# Patient Record
Sex: Female | Born: 2000
Health system: Southern US, Community
[De-identification: ages and names within clinical notes are randomized; demographics above are authoritative.]

## PROBLEM LIST (undated history)

## (undated) DIAGNOSIS — J45909 Unspecified asthma, uncomplicated: Secondary | ICD-10-CM

## (undated) DIAGNOSIS — L409 Psoriasis, unspecified: Secondary | ICD-10-CM

## (undated) DIAGNOSIS — F419 Anxiety disorder, unspecified: Secondary | ICD-10-CM

## (undated) DIAGNOSIS — B070 Plantar wart: Secondary | ICD-10-CM

## (undated) HISTORY — DX: Psoriasis, unspecified: L40.9

## (undated) HISTORY — DX: Plantar wart: B07.0

## (undated) HISTORY — PX: OTHER SURGICAL HISTORY: SHX169

## (undated) HISTORY — DX: Anxiety disorder, unspecified: F41.9

## (undated) HISTORY — DX: Unspecified asthma, uncomplicated: J45.909

---
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
## 2021-01-05 ENCOUNTER — Emergency Department
Admission: EM | Admit: 2021-01-05 | Discharge: 2021-01-05 | Disposition: A | Discharge status: Home / Self Care | Payer: Commercial Managed Care - PPO | Attending: Emergency Medicine | Admitting: Emergency Medicine

## 2021-01-05 ENCOUNTER — Other Ambulatory Visit: Payer: Self-pay

## 2021-01-05 ENCOUNTER — Ambulatory Visit
Admission: RE | Admit: 2021-01-05 | Discharge: 2021-01-05 | Payer: Commercial Managed Care - PPO | Source: Ambulatory Visit | Attending: Emergency Medicine | Admitting: Emergency Medicine

## 2021-01-05 ENCOUNTER — Emergency Department: Payer: Commercial Managed Care - PPO

## 2021-01-05 ENCOUNTER — Encounter: Payer: Self-pay | Admitting: Emergency Medicine

## 2021-01-05 VITALS — BP 129/80 | HR 85 | Temp 98.1°F | Resp 18

## 2021-01-05 DIAGNOSIS — R42 Dizziness and giddiness: Secondary | ICD-10-CM | POA: Insufficient documentation

## 2021-01-05 DIAGNOSIS — R109 Unspecified abdominal pain: Secondary | ICD-10-CM

## 2021-01-05 DIAGNOSIS — R531 Weakness: Secondary | ICD-10-CM

## 2021-01-05 DIAGNOSIS — R1032 Left lower quadrant pain: Secondary | ICD-10-CM | POA: Diagnosis not present

## 2021-01-05 DIAGNOSIS — M5412 Radiculopathy, cervical region: Secondary | ICD-10-CM | POA: Diagnosis not present

## 2021-01-05 DIAGNOSIS — R202 Paresthesia of skin: Secondary | ICD-10-CM | POA: Insufficient documentation

## 2021-01-05 DIAGNOSIS — R29898 Other symptoms and signs involving the musculoskeletal system: Secondary | ICD-10-CM | POA: Diagnosis not present

## 2021-01-05 LAB — CBC
HCT: 40.5 % (ref 36.0–46.0)
Hemoglobin: 13.9 g/dL (ref 12.0–15.0)
MCH: 30.5 pg (ref 26.0–34.0)
MCHC: 34.3 g/dL (ref 30.0–36.0)
MCV: 89 fL (ref 80.0–100.0)
Platelets: 318 10*3/uL (ref 150–400)
RBC: 4.55 MIL/uL (ref 3.87–5.11)
RDW: 12.4 % (ref 11.5–15.5)
WBC: 5 10*3/uL (ref 4.0–10.5)
nRBC: 0 % (ref 0.0–0.2)

## 2021-01-05 LAB — POCT URINALYSIS DIP (MANUAL ENTRY)
Bilirubin, UA: NEGATIVE
Blood, UA: NEGATIVE
Glucose, UA: NEGATIVE mg/dL
Ketones, POC UA: NEGATIVE mg/dL
Leukocytes, UA: NEGATIVE
Nitrite, UA: NEGATIVE
Protein Ur, POC: NEGATIVE mg/dL
Spec Grav, UA: 1.01 (ref 1.010–1.025)
Urobilinogen, UA: 0.2 E.U./dL
pH, UA: 5 (ref 5.0–8.0)

## 2021-01-05 LAB — BASIC METABOLIC PANEL
Anion gap: 8 (ref 5–15)
BUN: 10 mg/dL (ref 6–20)
CO2: 24 mmol/L (ref 22–32)
Calcium: 9.3 mg/dL (ref 8.9–10.3)
Chloride: 106 mmol/L (ref 98–111)
Creatinine, Ser: 0.89 mg/dL (ref 0.44–1.00)
GFR, Estimated: >60 mL/min (ref >60)
Glucose, Bld: 86 mg/dL (ref 70–99)
Potassium: 3.8 mmol/L (ref 3.5–5.1)
Sodium: 138 mmol/L (ref 135–145)

## 2021-01-05 LAB — POCT URINE PREGNANCY: Preg Test, Ur: NEGATIVE

## 2021-01-05 MED ORDER — DICYCLOMINE HCL 10 MG PO CAPS
20.0000 mg | ORAL_CAPSULE | Freq: Once | ORAL | Status: AC
  Administered 2021-01-05: 20 mg via ORAL
  Filled 2021-01-05: qty 2

## 2021-01-05 MED ORDER — PREDNISONE 10 MG (21) PO TBPK: ORAL_TABLET | ORAL | 0 refills | Status: DC

## 2021-01-05 MED ORDER — GABAPENTIN 300 MG PO CAPS
300.0000 mg | ORAL_CAPSULE | Freq: Once | ORAL | Status: AC
  Administered 2021-01-05: 300 mg via ORAL
  Filled 2021-01-05: qty 1

## 2021-01-05 MED ORDER — DICYCLOMINE HCL 10 MG PO CAPS: 10.0000 mg | ORAL_CAPSULE | Freq: Three times a day (TID) | ORAL | 0 refills | Status: DC | PRN

## 2021-01-05 NOTE — Discharge Instructions (Addendum)
Go to the emergency department for evaluation of your symptoms.    

## 2021-01-05 NOTE — Discharge Instructions (Addendum)
Please seek medical attention for any high fevers, chest pain, shortness of breath, change in behavior, persistent vomiting, bloody stool or any other new or concerning symptoms.  

## 2021-01-05 NOTE — ED Provider Notes (Signed)
[facility] Emergency Department Provider Note   ____________________________________________   I have reviewed the triage vital signs and the nursing notes.   HISTORY  Chief Complaint Left abdominal pain   History limited by: Not Limited   HPI Nicole Riddle is a 20 y.o. female who presents to the emergency department today with multiple complaints. The patient has complaint of left lower abdominal pain. Started last night. It has been constant. She has not noticed any change in defecation. No vomiting. Patient denies any unusual vaginal discharge. The patient states that at roughly the same time the abdominal pain started she also started having a burning sensation to her left axilla with numbness that radiated down her left arm.  The patient denies any unusual activity to her left arm or trauma to her left arm.  She does state that a little bit prior to both the abdominal pain and left arm issues she had an episode where she became quite dizzy.  She states that she gets dizziness occasionally.  Denies any headaches.  Patient denies any recent fevers or illness.    Records reviewed. Per medical record review patient has a history of going to urgent care prior to the emergency department. Urine pregnancy and UA were performed without any concerning abnormalities.   Past Medical History:  Diagnosis Date   Plantar wart    Psoriasis     Patient Active Problem List   Diagnosis Date Noted   Anxiety 12/26/2018   Exercise-induced asthma 12/20/2016   Plantar wart    Allergy to cats 04/01/2015   Psoriasis 04/01/2015    Past Surgical History:  Procedure Laterality Date   none      Prior to Admission medications   Medication Sig Start Date End Date Taking? Authorizing Provider  albuterol (VENTOLIN HFA) 108 (90 Base) MCG/ACT inhaler TAKE 2 PUFFS BY MOUTH EVERY 6 HOURS AS NEEDED FOR WHEEZE OR SHORTNESS OF BREATH 11/14/19   Olevia Perches P, DO   norethindrone-ethinyl estradiol (JUNEL FE 1/20) 1-20 MG-MCG tablet Take 1 tablet by mouth daily. 04/28/20   Johnson, Megan P, DO  sertraline (ZOLOFT) 100 MG tablet TAKE 1 TABLET BY MOUTH EVERY DAY 12/01/20   Olevia Perches P, DO    Allergies Patient has no known allergies.  Family History  Problem Relation Age of Onset   Thyroid disease Mother    Asthma Sister    Asthma Brother     Social History Social History   Tobacco Use   Smoking status: Never   Smokeless tobacco: Never  Vaping Use   Vaping Use: Every day  Substance Use Topics   Alcohol use: No    Alcohol/week: 0.0 standard drinks   Drug use: No    Review of Systems Constitutional: No fever/chills Eyes: No visual changes. ENT: No sore throat. Cardiovascular: Denies chest pain. Respiratory: Denies shortness of breath. Gastrointestinal: Positive for abdominal pain.  Genitourinary: Negative for dysuria. Musculoskeletal: Negative for back pain. Skin: Negative for rash. Neurological: Positive for numbness to left arm. Positive for dizziness.   ____________________________________________   PHYSICAL EXAM:  VITAL SIGNS: ED Triage Vitals  Enc Vitals Group     BP 01/05/21 1444 131/74     Pulse Rate 01/05/21 1444 84     Resp 01/05/21 1444 16     Temp 01/05/21 1444 98.7 F (37.1 C)     Temp Source 01/05/21 1444 Oral     SpO2 01/05/21 1444 99 %     Weight 01/05/21 1440  145 lb (65.8 kg)     Height 01/05/21 1440 5\' 8"  (1.727 m)   Constitutional: Alert and oriented.  Eyes: Conjunctivae are normal.  ENT      Head: Normocephalic and atraumatic.      Nose: No congestion/rhinnorhea.      Mouth/Throat: Mucous membranes are moist.      Neck: No stridor. Hematological/Lymphatic/Immunilogical: No cervical lymphadenopathy. Cardiovascular: Normal rate, regular rhythm.  No murmurs, rubs, or gallops.  Respiratory: Normal respiratory effort without tachypnea nor retractions. Breath sounds are clear and equal bilaterally.  No wheezes/rales/rhonchi. Gastrointestinal: Soft and minimally tender in the left lower quadrant.  Genitourinary: Deferred Musculoskeletal: Normal range of motion in all extremities. No lower extremity edema. Neurologic:  Normal speech and language. No gross focal neurologic deficits are appreciated.  Skin:  Skin is warm, dry and intact. No rash noted. Psychiatric: Mood and affect are normal. Speech and behavior are normal. Patient exhibits appropriate insight and judgment.  ____________________________________________    LABS (pertinent positives/negatives)  CBC wbc 5.0, hgb 13.9, plt 318 BMP wnl UA and urine preg done at urgent care reviewed ____________________________________________   EKG  None  ____________________________________________    RADIOLOGY  pelvis Unremarkable [place]  ____________________________________________   PROCEDURES  Procedures  ____________________________________________   INITIAL IMPRESSION / ASSESSMENT AND PLAN / ED COURSE  Pertinent labs & imaging results that were available during my care of the patient were reviewed by me and considered in my medical decision making (see chart for details).   Patient presented to the emergency department today with multiple medical complaints.  Terms of the left lower abdominal pain patient without leukocytosis.  Very minimal tenderness on exam.  Ultrasound without concerning abnormalities.  I did review UA and urine pregnancy that was obtained earlier at urgent care and they did not show any concerning findings.  This time somewhat unclear etiology although patient did feel better after Bentyl.  We did discuss possibility of obtaining a CT scan for further imaging however patient felt comfortable deferring at this time and I think that is reasonable.  Additionally patient was complaining of left arm numbness associated with left shoulder/axilla burning.  At this time do wonder if patient is suffering from  radiculopathy.  Discussed this with the patient.  We did discuss possibly obtaining an MRI although I have low suspicion for MS or stroke.  Again patient felt comfortable deferring MRI at this time.  Blood work without any concerning anemia or electrolyte abnormalities.  Discussed return precautions. ____________________________________________   FINAL CLINICAL IMPRESSION(S) / ED DIAGNOSES  Final diagnoses:  Left sided abdominal pain  Cervical radiculopathy     Note: This dictation was prepared with Dragon dictation. Any transcriptional errors that result from this process are unintentional     [place], MD 01/05/21 2146

## 2021-01-05 NOTE — ED Notes (Signed)
Md with pt and family 

## 2021-01-05 NOTE — ED Notes (Signed)
See triage note, pt reports LLQ pain, numbness in left arm and dizziness that started last night. NAD noted

## 2021-01-05 NOTE — ED Notes (Signed)
Pt resting quietly in hallway bed.  Family with pt.  Pt alert.

## 2021-01-05 NOTE — ED Triage Notes (Signed)
Pt reports that she is having LLQ pain with Nausea and arm numbness that began last night, she also was at school and felt dizzy and fell against the wall. She is also having a strong smell of odor in her urine. Went to urgent care before coming here.

## 2021-01-05 NOTE — ED Triage Notes (Signed)
Pt states last night she felt dizzy and nauseous. A few hours later she started to have LLQ abdominal stabbing pain and a strong urine odor. She also states she had some numbness to her left arm. Pain last night was 6/10 she reports no pain at this time.

## 2021-01-05 NOTE — ED Provider Notes (Signed)
Nicole Riddle    CSN: [id number] Arrival date & time: 01/05/21  1316      History   Chief Complaint Chief Complaint  Patient presents with   Abdominal Pain    Left side     HPI Nicole Riddle is a 20 y.o. female.  Patient presents with malodorous urine and left lower quadrant abdominal pain since yesterday.  She also reports numbness and weakness in her left arm which she states has improved today.  She also reports dizziness and nausea yesterday.  No falls or injury.  She denies fever, chills, vomiting, diarrhea, constipation, vaginal discharge, pelvic pain, or other symptoms.  No treatments attempted at home.  Her medical history includes asthma, psoriasis, and anxiety.  The history is provided by the patient and medical records.   Past Medical History:  Diagnosis Date   Plantar wart    Psoriasis     Patient Active Problem List   Diagnosis Date Noted   Anxiety 12/26/2018   Exercise-induced asthma 12/20/2016   Plantar wart    Allergy to cats 04/01/2015   Psoriasis 04/01/2015    Past Surgical History:  Procedure Laterality Date   none      OB History   No obstetric history on file.      Home Medications    Prior to Admission medications   Medication Sig Start Date End Date Taking? Authorizing Provider  albuterol (VENTOLIN HFA) 108 (90 Base) MCG/ACT inhaler TAKE 2 PUFFS BY MOUTH EVERY 6 HOURS AS NEEDED FOR WHEEZE OR SHORTNESS OF BREATH 11/14/19  Yes Johnson, Megan P, DO  norethindrone-ethinyl estradiol (JUNEL FE 1/20) 1-20 MG-MCG tablet Take 1 tablet by mouth daily. 04/28/20  Yes Johnson, Megan P, DO  sertraline (ZOLOFT) 100 MG tablet TAKE 1 TABLET BY MOUTH EVERY DAY 12/01/20  Yes Dorcas Carrow, DO    Family History Family History  Problem Relation Age of Onset   Thyroid disease Mother    Asthma Sister    Asthma Brother     Social History Social History   Tobacco Use   Smoking status: Never   Smokeless tobacco: Never  Vaping Use   Vaping Use:  Every day  Substance Use Topics   Alcohol use: No    Alcohol/week: 0.0 standard drinks   Drug use: No     Allergies   Patient has no known allergies.   Review of Systems Review of Systems  Constitutional:  Negative for chills and fever.  Respiratory:  Negative for cough and shortness of breath.   Cardiovascular:  Negative for chest pain and palpitations.  Gastrointestinal:  Positive for abdominal pain and nausea. Negative for constipation, diarrhea and vomiting.  Genitourinary:  Negative for dysuria, hematuria, pelvic pain and vaginal discharge.       Malodorous urine  Skin:  Negative for color change and rash.  Neurological:  Positive for dizziness. Negative for weakness and numbness.       Numbness and weakness in left arm.  All other systems reviewed and are negative.   Physical Exam Triage Vital Signs ED Triage Vitals  Enc Vitals Group     BP      Pulse      Resp      Temp      Temp src      SpO2      Weight      Height      Head Circumference      Peak Flow  Pain Score      Pain Loc      Pain Edu?      Excl. in GC?    No data found.  Updated Vital Signs BP 129/80 (BP Location: Left Arm)   Pulse 85   Temp 98.1 F (36.7 C) (Oral)   Resp 18   LMP  (LMP Unknown) Comment: Pt reports no cycle with birth control  SpO2 98%   Visual Acuity Right Eye Distance:   Left Eye Distance:   Bilateral Distance:    Right Eye Near:   Left Eye Near:    Bilateral Near:     Physical Exam Vitals and nursing note reviewed.  Constitutional:      General: She is not in acute distress.    Appearance: She is well-developed. She is not ill-appearing.  HENT:     Head: Normocephalic and atraumatic.     Right Ear: Tympanic membrane normal.     Left Ear: Tympanic membrane normal.     Nose: Nose normal.     Mouth/Throat:     Mouth: Mucous membranes are moist.     Pharynx: Oropharynx is clear.  Eyes:     Conjunctiva/sclera: Conjunctivae normal.  Cardiovascular:      Rate and Rhythm: Normal rate and regular rhythm.     Heart sounds: Normal heart sounds.  Pulmonary:     Effort: Pulmonary effort is normal. No respiratory distress.     Breath sounds: Normal breath sounds.  Abdominal:     General: Bowel sounds are normal. There is no distension.     Palpations: Abdomen is soft.     Tenderness: There is no abdominal tenderness. There is no right CVA tenderness, left CVA tenderness, guarding or rebound.  Musculoskeletal:        General: No swelling, tenderness, deformity or signs of injury. Normal range of motion.     Cervical back: Neck supple.  Skin:    General: Skin is warm and dry.     Capillary Refill: Capillary refill takes less than 2 seconds.     Findings: No bruising, erythema, lesion or rash.  Neurological:     General: No focal deficit present.     Mental Status: She is alert and oriented to person, place, and time.     Sensory: Sensory deficit present.     Motor: Weakness present.     Gait: Gait normal.     Comments: Left hand grip weaker than right.  Patient reports decreased sensation in left arm.  2+ pulses and brisk capillary refill.      UC Treatments / Results  Labs (all labs ordered are listed, but only abnormal results are displayed) Labs Reviewed  POCT URINALYSIS DIP (MANUAL ENTRY)  POCT URINE PREGNANCY    EKG   Radiology No results found.  Procedures Procedures (including critical care time)  Medications Ordered in UC Medications - No data to display  Initial Impression / Assessment and Plan / UC Course  I have reviewed the triage vital signs and the nursing notes.  Pertinent labs & imaging results that were available during my care of the patient were reviewed by me and considered in my medical decision making (see chart for details).  Left upper extremity weakness and decreased sensation.  Left lower quadrant abdominal pain.  Dizziness.  Urine normal, urine pregnancy negative.  Based on patient's symptoms and  exam, sending her to the ED for evaluation.  Her vital signs are stable.  She is alert  and oriented.  She feels stable to transport herself to the ED.     Final Clinical Impressions(s) / UC Diagnoses   Final diagnoses:  Weakness of left upper extremity  Left lower quadrant abdominal pain  Dizziness     Discharge Instructions      Go to the emergency department for evaluation of your symptoms.     ED Prescriptions   None    PDMP not reviewed this encounter.   Mickie Bail, NP 01/05/21 1400

## 2021-01-05 NOTE — ED Notes (Signed)
Pt to US.

## 2021-01-11 ENCOUNTER — Other Ambulatory Visit: Payer: Self-pay | Admitting: Family Medicine

## 2021-01-11 NOTE — Telephone Encounter (Signed)
Left VM for pt to CB to schedule appt. #30 courtesy refill provided.

## 2021-02-04 ENCOUNTER — Other Ambulatory Visit: Payer: Self-pay | Admitting: Family Medicine

## 2021-02-04 NOTE — Telephone Encounter (Signed)
Requested medications are due for refill today yes  Requested medications are on the active medication list yes  Last refill 01/11/21  Last visit 11/14/19  Future visit scheduled 03/18/21  Notes to clinic Was asked to make appt prior to refills, appt not until 03/18/21, unsure if that is the one made or if MD meant come in sooner than that appt. It could have been the first available appt. Please assess.  Requested Prescriptions  Pending Prescriptions Disp Refills   sertraline (ZOLOFT) 100 MG tablet [Pharmacy Med Name: SERTRALINE HCL 100 MG TABLET] 90 tablet 1    Sig: TAKE 1 TABLET BY MOUTH EVERY DAY     Psychiatry:  Antidepressants - SSRI Failed - 02/04/2021  9:31 AM      Failed - Valid encounter within last 6 months    Recent Outpatient Visits           9 months ago Encounter for initial prescription of contraceptive pills   [facility] [place], Megan P, DO   1 year ago Routine general medical examination at a health care facility   [facility] [place], [place], DO   1 year ago Anxiety   [facility] [place], [place], DO   2 years ago Anxiety   [facility] [place], [place], DO   2 years ago Anxiety   [facility] [place], [place], DO       Future Appointments             In 1 month Johnson, Oralia Rud, DO [employer], [facility]

## 2021-03-18 ENCOUNTER — Encounter: Payer: Self-pay | Admitting: Family Medicine

## 2021-03-18 ENCOUNTER — Ambulatory Visit (INDEPENDENT_AMBULATORY_CARE_PROVIDER_SITE_OTHER): Payer: Commercial Managed Care - PPO | Admitting: Family Medicine

## 2021-03-18 ENCOUNTER — Other Ambulatory Visit: Payer: Self-pay

## 2021-03-18 VITALS — BP 110/75 | HR 81 | Temp 98.3°F | Ht 69.0 in | Wt 155.0 lb

## 2021-03-18 DIAGNOSIS — Z0000 Encounter for general adult medical examination without abnormal findings: Secondary | ICD-10-CM

## 2021-03-18 DIAGNOSIS — F419 Anxiety disorder, unspecified: Secondary | ICD-10-CM | POA: Diagnosis not present

## 2021-03-18 LAB — URINALYSIS, ROUTINE W REFLEX MICROSCOPIC
Bilirubin, UA: NEGATIVE
Glucose, UA: NEGATIVE
Ketones, UA: NEGATIVE
Leukocytes,UA: NEGATIVE
Nitrite, UA: NEGATIVE
Protein,UA: NEGATIVE
RBC, UA: NEGATIVE
Specific Gravity, UA: 1.02 (ref 1.005–1.030)
Urobilinogen, Ur: 0.2 mg/dL (ref 0.2–1.0)
pH, UA: 6.5 (ref 5.0–7.5)

## 2021-03-18 MED ORDER — ALBUTEROL SULFATE HFA 108 (90 BASE) MCG/ACT IN AERS: INHALATION_SPRAY | RESPIRATORY_TRACT | 3 refills | Status: DC

## 2021-03-18 MED ORDER — SERTRALINE HCL 100 MG PO TABS: 100.0000 mg | ORAL_TABLET | Freq: Every day | ORAL | 1 refills | Status: DC

## 2021-03-18 MED ORDER — NORETHIN ACE-ETH ESTRAD-FE 1-20 MG-MCG PO TABS: 1.0000 | ORAL_TABLET | Freq: Every day | ORAL | 11 refills | Status: DC

## 2021-03-18 NOTE — Assessment & Plan Note (Signed)
Under good control on current regimen. Continue current regimen. Continue to monitor. Call with any concerns. Refills given.   

## 2021-03-18 NOTE — Progress Notes (Signed)
BP 110/75   Pulse 81   Temp 98.3 F (36.8 C)   Ht 5\' 9"  (1.753 m)   Wt 155 lb (70.3 kg)   SpO2 99%   BMI 22.89 kg/m    Subjective:    Patient ID: , female    DOB: February 01, 2001, 20 y.o.   MRN: [id number]  HPI: Nicole Riddle is a 20 y.o. female presenting on 03/18/2021 for comprehensive medical examination. Current medical complaints include:  ANXIETY/STRESS Duration: chronic Status:controlled Anxious mood: no  Excessive worrying: no Irritability: no  Sweating: no Nausea: no Palpitations:no Hyperventilation: no Panic attacks: no Agoraphobia: no  Obscessions/compulsions: no Depressed mood: no Depression screen [facility] 2/9 03/18/2021 11/14/2019 03/27/2019 12/26/2018 09/06/2018  Decreased Interest 0 0 0 0 0  Down, Depressed, Hopeless 0 0 0 2 0  PHQ - 2 Score 0 0 0 2 0  Altered sleeping 2 0 2 0 0  Tired, decreased energy 2 0 0 0 0  Change in appetite 2 0 2 0 0  Feeling bad or failure about yourself  0 1 1 2  0  Trouble concentrating 0 0 0 0 0  Moving slowly or fidgety/restless 0 0 0 0 0  Suicidal thoughts 0 0 0 0 0  PHQ-9 Score 6 1 5 4  0  Difficult doing work/chores - Not difficult at all Not difficult at all Somewhat difficult Not difficult at all   GAD 7 : Generalized Anxiety Score 03/18/2021 11/14/2019 03/27/2019 12/26/2018  Nervous, Anxious, on Edge 0 1 0 1  Control/stop worrying 0 1 0 1  Worry too much - different things 0 1 1 1   Trouble relaxing 0 0 0 0  Restless 0 0 0 0  Easily annoyed or irritable 1 1 1 1   Afraid - awful might happen 3 1 0 0  Total GAD 7 Score 4 5 2 4   Anxiety Difficulty Not difficult at all Not difficult at all Not difficult at all Somewhat difficult   Anhedonia: no Weight changes: no Insomnia: no   Hypersomnia: no Fatigue/loss of energy: no Feelings of worthlessness: no Feelings of guilt: no Impaired concentration/indecisiveness: no Suicidal ideations: no  Crying spells: no Recent Stressors/Life Changes: no   Relationship problems: no    Family stress: no     Financial stress: no    Job stress: no    Recent death/loss: no  She currently lives with: parents, siblings Menopausal Symptoms: no  Depression Screen done today and results listed below:  Depression screen [facility] 2/9 03/18/2021 11/14/2019 03/27/2019 12/26/2018 09/06/2018  Decreased Interest 0 0 0 0 0  Down, Depressed, Hopeless 0 0 0 2 0  PHQ - 2 Score 0 0 0 2 0  Altered sleeping 2 0 2 0 0  Tired, decreased energy 2 0 0 0 0  Change in appetite 2 0 2 0 0  Feeling bad or failure about yourself  0 1 1 2  0  Trouble concentrating 0 0 0 0 0  Moving slowly or fidgety/restless 0 0 0 0 0  Suicidal thoughts 0 0 0 0 0  PHQ-9 Score 6 1 5 4  0  Difficult doing work/chores - Not difficult at all Not difficult at all Somewhat difficult Not difficult at all     Past Medical History:  Past Medical History:  Diagnosis Date   Plantar wart    Psoriasis     Surgical History:  Past Surgical History:  Procedure Laterality Date   none      Medications:  No current outpatient medications on file prior to visit.   No current facility-administered medications on file prior to visit.    Allergies:  No Known Allergies  Social History:  Social History   Socioeconomic History   Marital status: Single    Spouse name: Not on file   Number of children: Not on file   Years of education: Not on file   Highest education level: Not on file  Occupational History   Not on file  Tobacco Use   Smoking status: Never   Smokeless tobacco: Never  Vaping Use   Vaping Use: Every day  Substance and Sexual Activity   Alcohol use: No    Alcohol/week: 0.0 standard drinks   Drug use: No   Sexual activity: Yes    Birth control/protection: Pill  Other Topics Concern   Not on file  Social History Narrative   Not on file   Social Determinants of Health   Financial Resource Strain: Not on file  Food Insecurity: Not on file  Transportation Needs: Not on file  Physical Activity: Not  on file  Stress: Not on file  Social Connections: Not on file  Intimate Partner Violence: Not on file   Social History   Tobacco Use  Smoking Status Never  Smokeless Tobacco Never   Social History   Substance and Sexual Activity  Alcohol Use No   Alcohol/week: 0.0 standard drinks    Family History:  Family History  Problem Relation Age of Onset   Thyroid disease Mother    Asthma Sister    Asthma Brother     Past medical history, surgical history, medications, allergies, family history and social history reviewed with patient today and changes made to appropriate areas of the chart.   Review of Systems  Constitutional:  Positive for diaphoresis. Negative for chills, fever, malaise/fatigue and weight loss.  HENT: Negative.    Eyes: Negative.   Respiratory: Negative.    Cardiovascular: Negative.   Gastrointestinal:  Positive for constipation. Negative for abdominal pain, blood in stool, diarrhea, heartburn, melena, nausea and vomiting.  Genitourinary: Negative.   Musculoskeletal: Negative.   Skin: Negative.   Neurological: Negative.  Negative for dizziness, tingling, tremors, sensory change, speech change, focal weakness, seizures, loss of consciousness, weakness and headaches.       Dizzy/headache  Endo/Heme/Allergies: Negative.   Psychiatric/Behavioral: Negative.    All other ROS negative except what is listed above and in the HPI.      Objective:    BP 110/75   Pulse 81   Temp 98.3 F (36.8 C)   Ht 5\' 9"  (1.753 m)   Wt 155 lb (70.3 kg)   SpO2 99%   BMI 22.89 kg/m   Wt Readings from Last 3 Encounters:  03/18/21 155 lb (70.3 kg) (84 %, Z= 1.01)*  01/05/21 145 lb (65.8 kg) (76 %, Z= 0.71)*  04/28/20 145 lb (65.8 kg) (78 %, Z= 0.78)*   * Growth percentiles are based on CDC (Girls, 2-20 Years) data.    Physical Exam Vitals and nursing note reviewed.  Constitutional:      General: She is not in acute distress.    Appearance: Normal appearance. She is not  ill-appearing, toxic-appearing or diaphoretic.  HENT:     Head: Normocephalic and atraumatic.     Right Ear: Tympanic membrane, ear canal and external ear normal. There is no impacted cerumen.     Left Ear: Tympanic membrane, ear canal and external ear normal. There is  no impacted cerumen.     Nose: Nose normal. No congestion or rhinorrhea.     Mouth/Throat:     Mouth: Mucous membranes are moist.     Pharynx: Oropharynx is clear. No oropharyngeal exudate or posterior oropharyngeal erythema.  Eyes:     General: No scleral icterus.       Right eye: No discharge.        Left eye: No discharge.     Extraocular Movements: Extraocular movements intact.     Conjunctiva/sclera: Conjunctivae normal.     Pupils: Pupils are equal, round, and reactive to light.  Neck:     Vascular: No carotid bruit.  Cardiovascular:     Rate and Rhythm: Normal rate and regular rhythm.     Pulses: Normal pulses.     Heart sounds: No murmur heard.   No friction rub. No gallop.  Pulmonary:     Effort: Pulmonary effort is normal. No respiratory distress.     Breath sounds: Normal breath sounds. No stridor. No wheezing, rhonchi or rales.  Chest:     Chest wall: No tenderness.  Abdominal:     General: Abdomen is flat. Bowel sounds are normal. There is no distension.     Palpations: Abdomen is soft. There is no mass.     Tenderness: There is no abdominal tenderness. There is no right CVA tenderness, left CVA tenderness, guarding or rebound.     Hernia: No hernia is present.  Genitourinary:    Comments: Breast and pelvic exams deferred with shared decision making Musculoskeletal:        General: No swelling, tenderness, deformity or signs of injury.     Cervical back: Normal range of motion and neck supple. No rigidity. No muscular tenderness.     Right lower leg: No edema.     Left lower leg: No edema.  Lymphadenopathy:     Cervical: No cervical adenopathy.  Skin:    General: Skin is warm and dry.      Capillary Refill: Capillary refill takes less than 2 seconds.     Coloration: Skin is not jaundiced or pale.     Findings: No bruising, erythema, lesion or rash.  Neurological:     General: No focal deficit present.     Mental Status: She is alert and oriented to person, place, and time. Mental status is at baseline.     Cranial Nerves: No cranial nerve deficit.     Sensory: No sensory deficit.     Motor: No weakness.     Coordination: Coordination normal.     Gait: Gait normal.     Deep Tendon Reflexes: Reflexes normal.  Psychiatric:        Mood and Affect: Mood normal.        Behavior: Behavior normal.        Thought Content: Thought content normal.        Judgment: Judgment normal.    Results for orders placed or performed during the hospital encounter of 01/05/21  Basic metabolic panel  Result Value Ref Range   Sodium 138 135 - 145 mmol/L   Potassium 3.8 3.5 - 5.1 mmol/L   Chloride 106 98 - 111 mmol/L   CO2 24 22 - 32 mmol/L   Glucose, Bld 86 70 - 99 mg/dL   BUN 10 6 - 20 mg/dL   Creatinine, Ser 6.71 0.44 - 1.00 mg/dL   Calcium 9.3 8.9 - 24.5 mg/dL   GFR, Estimated >80 >99 mL/min  Anion gap 8 5 - 15  CBC  Result Value Ref Range   WBC 5.0 4.0 - 10.5 K/uL   RBC 4.55 3.87 - 5.11 MIL/uL   Hemoglobin 13.9 12.0 - 15.0 g/dL   HCT 96.0 45.4 - 09.8 %   MCV 89.0 80.0 - 100.0 fL   MCH 30.5 26.0 - 34.0 pg   MCHC 34.3 30.0 - 36.0 g/dL   RDW 11.9 14.7 - 82.9 %   Platelets 318 150 - 400 K/uL   nRBC 0.0 0.0 - 0.2 %      Assessment & Plan:   Problem List Items Addressed This Visit       Other   Anxiety    Under good control on current regimen. Continue current regimen. Continue to monitor. Call with any concerns. Refills given.        Relevant Medications   sertraline (ZOLOFT) 100 MG tablet   Other Visit Diagnoses     Routine general medical examination at a health care facility    -  Primary   Vaccines up to date/declined. Screening labs checked today. Continue  diet and exercise. Call with any concerns.   Relevant Orders   GC/Chlamydia Probe Amp   Urinalysis, Routine w reflex microscopic   CBC with Differential/Platelet   Comprehensive metabolic panel   Lipid Panel w/o Chol/HDL Ratio   TSH        Follow up plan: Return in about 6 months (around 09/16/2021).   LABORATORY TESTING:  - Pap smear: not applicable  IMMUNIZATIONS:   - Tdap: Tetanus vaccination status reviewed: last tetanus booster within 10 years. - Influenza: Refused - Pneumovax: Not applicable - Prevnar: Not applicable - COVID: Refused - HPV:  Will consider  PATIENT COUNSELING:   Advised to take 1 mg of folate supplement per day if capable of pregnancy.   Sexuality: Discussed sexually transmitted diseases, partner selection, use of condoms, avoidance of unintended pregnancy  and contraceptive alternatives.   Advised to avoid cigarette smoking.  I discussed with the patient that most people either abstain from alcohol or drink within safe limits (<=14/week and <=4 drinks/occasion for males, <=7/weeks and <= 3 drinks/occasion for females) and that the risk for alcohol disorders and other health effects rises proportionally with the number of drinks per week and how often a drinker exceeds daily limits.  Discussed cessation/primary prevention of drug use and availability of treatment for abuse.   Diet: Encouraged to adjust caloric intake to maintain  or achieve ideal body weight, to reduce intake of dietary saturated fat and total fat, to limit sodium intake by avoiding high sodium foods and not adding table salt, and to maintain adequate dietary potassium and calcium preferably from fresh fruits, vegetables, and low-fat dairy products.    stressed the importance of regular exercise  Injury prevention: Discussed safety belts, safety helmets, smoke detector, smoking near bedding or upholstery.   Dental health: Discussed importance of regular tooth brushing, flossing, and  dental visits.    NEXT PREVENTATIVE PHYSICAL DUE IN 1 YEAR. Return in about 6 months (around 09/16/2021).

## 2021-03-19 LAB — TSH: TSH: 1.8 u[IU]/mL (ref 0.450–4.500)

## 2021-03-19 LAB — COMPREHENSIVE METABOLIC PANEL
ALT: 11 IU/L (ref 0–32)
AST: 13 IU/L (ref 0–40)
Albumin/Globulin Ratio: 2 (ref 1.2–2.2)
Albumin: 4.7 g/dL (ref 3.9–5.0)
Alkaline Phosphatase: 55 IU/L (ref 42–106)
BUN/Creatinine Ratio: 11 (ref 9–23)
BUN: 9 mg/dL (ref 6–20)
Bilirubin Total: 0.2 mg/dL (ref 0.0–1.2)
CO2: 21 mmol/L (ref 20–29)
Calcium: 9.7 mg/dL (ref 8.7–10.2)
Chloride: 102 mmol/L (ref 96–106)
Creatinine, Ser: 0.83 mg/dL (ref 0.57–1.00)
Globulin, Total: 2.4 g/dL (ref 1.5–4.5)
Glucose: 61 mg/dL — ABNORMAL LOW (ref 70–99)
Potassium: 4.2 mmol/L (ref 3.5–5.2)
Sodium: 139 mmol/L (ref 134–144)
Total Protein: 7.1 g/dL (ref 6.0–8.5)
eGFR: 104 mL/min/{1.73_m2} (ref >59)

## 2021-03-19 LAB — CBC WITH DIFFERENTIAL/PLATELET
Basophils Absolute: 0 10*3/uL (ref 0.0–0.2)
Basos: 1 %
EOS (ABSOLUTE): 0.2 10*3/uL (ref 0.0–0.4)
Eos: 4 %
Hematocrit: 42 % (ref 34.0–46.6)
Hemoglobin: 13.6 g/dL (ref 11.1–15.9)
Immature Grans (Abs): 0 10*3/uL (ref 0.0–0.1)
Immature Granulocytes: 0 %
Lymphocytes Absolute: 2 10*3/uL (ref 0.7–3.1)
Lymphs: 43 %
MCH: 29.1 pg (ref 26.6–33.0)
MCHC: 32.4 g/dL (ref 31.5–35.7)
MCV: 90 fL (ref 79–97)
Monocytes Absolute: 0.4 10*3/uL (ref 0.1–0.9)
Monocytes: 8 %
Neutrophils Absolute: 2 10*3/uL (ref 1.4–7.0)
Neutrophils: 44 %
Platelets: 303 10*3/uL (ref 150–450)
RBC: 4.67 x10E6/uL (ref 3.77–5.28)
RDW: 12.3 % (ref 11.7–15.4)
WBC: 4.7 10*3/uL (ref 3.4–10.8)

## 2021-03-19 LAB — LIPID PANEL W/O CHOL/HDL RATIO
Cholesterol, Total: 140 mg/dL (ref 100–169)
HDL: 47 mg/dL (ref >39)
LDL Chol Calc (NIH): 78 mg/dL (ref 0–109)
Triglycerides: 79 mg/dL (ref 0–89)
VLDL Cholesterol Cal: 15 mg/dL (ref 5–40)

## 2021-03-21 LAB — GC/CHLAMYDIA PROBE AMP
Chlamydia trachomatis, NAA: NEGATIVE
Neisseria Gonorrhoeae by PCR: NEGATIVE

## 2021-09-16 ENCOUNTER — Encounter: Payer: Self-pay | Admitting: Family Medicine

## 2021-09-16 ENCOUNTER — Ambulatory Visit (INDEPENDENT_AMBULATORY_CARE_PROVIDER_SITE_OTHER): Payer: Commercial Managed Care - PPO | Admitting: Family Medicine

## 2021-09-16 DIAGNOSIS — F419 Anxiety disorder, unspecified: Secondary | ICD-10-CM

## 2021-09-16 MED ORDER — SERTRALINE HCL 100 MG PO TABS: 100.0000 mg | ORAL_TABLET | Freq: Every day | ORAL | 1 refills | Status: DC

## 2021-09-16 MED ORDER — NORETHIN ACE-ETH ESTRAD-FE 1-20 MG-MCG PO TABS: 1.0000 | ORAL_TABLET | Freq: Every day | ORAL | 3 refills | Status: DC

## 2021-09-16 NOTE — Progress Notes (Signed)
BP (!) 92/59   Pulse 82   Temp 97.9 F (36.6 C)   Wt 161 lb 3.2 oz (73.1 kg)   SpO2 98%   BMI 23.81 kg/m    Subjective:    Patient ID: Nicole Riddle, female    DOB: [birth year], 21 y.o.   MRN: [id number]  HPI: Nicole Riddle is a 21 y.o. female  Chief Complaint  Patient presents with   Anxiety   ANXIETY/STRESS Duration: chronic Status:controlled Anxious mood: no  Excessive worrying: no Irritability: no  Sweating: no Nausea: no Palpitations:no Hyperventilation: no Panic attacks: no Agoraphobia: no  Obscessions/compulsions: no Depressed mood: no    09/16/2021    8:26 AM 03/18/2021    1:23 PM 11/14/2019   10:58 AM 03/27/2019    9:38 AM 12/26/2018    8:54 AM  Depression screen PHQ 2/9  Decreased Interest 0 0 0 0 0  Down, Depressed, Hopeless 0 0 0 0 2  PHQ - 2 Score 0 0 0 0 2  Altered sleeping 3 2 0 2 0  Tired, decreased energy 3 2 0 0 0  Change in appetite 0 2 0 2 0  Feeling bad or failure about yourself  0 0 _0 Trouble concentrating 0 0 0 0 0  Moving slowly or fidgety/restless 0 0 0 0 0  Suicidal thoughts 0 0 0 0 0  PHQ-9 Score _1 Difficult doing work/chores   Not difficult at all Not difficult at all Somewhat difficult      09/16/2021    8:26 AM 03/18/2021    1:24 PM 11/14/2019   10:58 AM 03/27/2019    9:40 AM  GAD 7 : Generalized Anxiety Score  Nervous, Anxious, on Edge 0 0 1 0  Control/stop worrying 0 0 1 0  Worry too much - different things 0 0 1 1  Trouble relaxing 0 0 0 0  Restless 0 0 0 0  Easily annoyed or irritable 0 _2 Afraid - awful might happen 0 3 1 0  Total GAD 7 Score 0 _3 Anxiety Difficulty Not difficult at all Not difficult at all Not difficult at all Not difficult at all   Anhedonia: no Weight changes: no Insomnia: no   Hypersomnia: no Fatigue/loss of energy: no Feelings of worthlessness: no Feelings of guilt: no Impaired concentration/indecisiveness: no Suicidal ideations: no  Crying spells: no Recent Stressors/Life  Changes: no   Relationship problems: no   Family stress: no     Financial stress: no    Job stress: no    Recent death/loss: no   Relevant past medical, surgical, family and social history reviewed and updated as indicated. Interim medical history since our last visit reviewed. Allergies and medications reviewed and updated.  Review of Systems  Constitutional: Negative.   Respiratory: Negative.    Cardiovascular: Negative.   Gastrointestinal: Negative.   Musculoskeletal: Negative.   Psychiatric/Behavioral: Negative.     Per HPI unless specifically indicated above     Objective:    BP (!) 92/59   Pulse 82   Temp 97.9 F (36.6 C)   Wt 161 lb 3.2 oz (73.1 kg)   SpO2 98%   BMI 23.81 kg/m   Wt Readings from Last 3 Encounters:  09/16/21 161 lb 3.2 oz (73.1 kg)  03/18/21 155 lb (70.3 kg) (84 %, Z= 1.01)*  01/05/21 145 lb (65.8 kg) (76 %, Z= 0.71)*   * Growth  percentiles are based on CDC (Girls, 2-20 Years) data.    Physical Exam Vitals and nursing note reviewed.  Constitutional:      General: She is not in acute distress.    Appearance: Normal appearance. She is not ill-appearing, toxic-appearing or diaphoretic.  HENT:     Head: Normocephalic and atraumatic.     Right Ear: External ear normal.     Left Ear: External ear normal.     Nose: Nose normal.     Mouth/Throat:     Mouth: Mucous membranes are moist.     Pharynx: Oropharynx is clear.  Eyes:     General: No scleral icterus.       Right eye: No discharge.        Left eye: No discharge.     Extraocular Movements: Extraocular movements intact.     Conjunctiva/sclera: Conjunctivae normal.     Pupils: Pupils are equal, round, and reactive to light.  Cardiovascular:     Rate and Rhythm: Normal rate and regular rhythm.     Pulses: Normal pulses.     Heart sounds: Normal heart sounds. No murmur heard.   No friction rub. No gallop.  Pulmonary:     Effort: Pulmonary effort is normal. No respiratory distress.      Breath sounds: Normal breath sounds. No stridor. No wheezing, rhonchi or rales.  Chest:     Chest wall: No tenderness.  Musculoskeletal:        General: Normal range of motion.     Cervical back: Normal range of motion and neck supple.  Skin:    General: Skin is warm and dry.     Capillary Refill: Capillary refill takes less than 2 seconds.     Coloration: Skin is not jaundiced or pale.     Findings: No bruising, erythema, lesion or rash.  Neurological:     General: No focal deficit present.     Mental Status: She is alert and oriented to person, place, and time. Mental status is at baseline.  Psychiatric:        Mood and Affect: Mood normal.        Behavior: Behavior normal.        Thought Content: Thought content normal.        Judgment: Judgment normal.    Results for orders placed or performed in visit on 03/18/21  GC/Chlamydia Probe Amp   Specimen: Urine   UR  Result Value Ref Range   Chlamydia trachomatis, NAA Negative Negative   Neisseria Gonorrhoeae by PCR Negative Negative  Urinalysis, Routine w reflex microscopic  Result Value Ref Range   Specific Gravity, UA 1.020 1.005 - 1.030   pH, UA 6.5 5.0 - 7.5   Color, UA Yellow Yellow   Appearance Ur Clear Clear   Leukocytes,UA Negative Negative   Protein,UA Negative Negative/Trace   Glucose, UA Negative Negative   Ketones, UA Negative Negative   RBC, UA Negative Negative   Bilirubin, UA Negative Negative   Urobilinogen, Ur 0.2 0.2 - 1.0 mg/dL   Nitrite, UA Negative Negative  CBC with Differential/Platelet  Result Value Ref Range   WBC 4.7 3.4 - 10.8 x10E3/uL   RBC 4.67 3.77 - 5.28 x10E6/uL   Hemoglobin 13.6 11.1 - 15.9 g/dL   Hematocrit 42.0 34.0 - 46.6 %   MCV 90 79 - 97 fL   MCH 29.1 26.6 - 33.0 pg   MCHC 32.4 31.5 - 35.7 g/dL   RDW 12.3 11.7 - 15.4 %  Platelets 303 150 - 450 x10E3/uL   Neutrophils 44 Not Estab. %   Lymphs 43 Not Estab. %   Monocytes 8 Not Estab. %   Eos 4 Not Estab. %   Basos 1 Not  Estab. %   Neutrophils Absolute 2.0 1.4 - 7.0 x10E3/uL   Lymphocytes Absolute 2.0 0.7 - 3.1 x10E3/uL   Monocytes Absolute 0.4 0.1 - 0.9 x10E3/uL   EOS (ABSOLUTE) 0.2 0.0 - 0.4 x10E3/uL   Basophils Absolute 0.0 0.0 - 0.2 x10E3/uL   Immature Granulocytes 0 Not Estab. %   Immature Grans (Abs) 0.0 0.0 - 0.1 x10E3/uL  Comprehensive metabolic panel  Result Value Ref Range   Glucose 61 (L) 70 - 99 mg/dL   BUN 9 6 - 20 mg/dL   Creatinine, Ser 0.83 0.57 - 1.00 mg/dL   eGFR 104 >59 mL/min/1.73   BUN/Creatinine Ratio 11 9 - 23   Sodium 139 134 - 144 mmol/L   Potassium 4.2 3.5 - 5.2 mmol/L   Chloride 102 96 - 106 mmol/L   CO2 21 20 - 29 mmol/L   Calcium 9.7 8.7 - 10.2 mg/dL   Total Protein 7.1 6.0 - 8.5 g/dL   Albumin 4.7 3.9 - 5.0 g/dL   Globulin, Total 2.4 1.5 - 4.5 g/dL   Albumin/Globulin Ratio 2.0 1.2 - 2.2   Bilirubin Total 0.2 0.0 - 1.2 mg/dL   Alkaline Phosphatase 55 42 - 106 IU/L   AST 13 0 - 40 IU/L   ALT 11 0 - 32 IU/L  Lipid Panel w/o Chol/HDL Ratio  Result Value Ref Range   Cholesterol, Total 140 100 - 169 mg/dL   Triglycerides 79 0 - 89 mg/dL   HDL 47 >39 mg/dL   VLDL Cholesterol Cal 15 5 - 40 mg/dL   LDL Chol Calc (NIH) 78 0 - 109 mg/dL  TSH  Result Value Ref Range   TSH 1.800 0.450 - 4.500 uIU/mL      Assessment & Plan:   Problem List Items Addressed This Visit       Other   Anxiety    Under good control on current regimen. Continue current regimen. Continue to monitor. Call with any concerns. Refills given.         Relevant Medications   sertraline (ZOLOFT) 100 MG tablet     Follow up plan: Return in about 6 months (around 03/18/2022) for physical.

## 2021-09-16 NOTE — Assessment & Plan Note (Signed)
Under good control on current regimen. Continue current regimen. Continue to monitor. Call with any concerns. Refills given.   

## 2022-01-18 ENCOUNTER — Ambulatory Visit (INDEPENDENT_AMBULATORY_CARE_PROVIDER_SITE_OTHER): Payer: Commercial Managed Care - PPO | Admitting: Family Medicine

## 2022-01-18 ENCOUNTER — Encounter: Payer: Self-pay | Admitting: Family Medicine

## 2022-01-18 VITALS — BP 102/68 | HR 83 | Temp 98.2°F | Wt 164.7 lb

## 2022-01-18 DIAGNOSIS — J4521 Mild intermittent asthma with (acute) exacerbation: Secondary | ICD-10-CM

## 2022-01-18 MED ORDER — PREDNISONE 10 MG PO TABS: ORAL_TABLET | ORAL | 0 refills | Status: DC

## 2022-01-18 MED ORDER — ALBUTEROL SULFATE (2.5 MG/3ML) 0.083% IN NEBU: 2.5000 mg | INHALATION_SOLUTION | Freq: Once | RESPIRATORY_TRACT | Status: AC

## 2022-01-18 MED ORDER — ALBUTEROL SULFATE HFA 108 (90 BASE) MCG/ACT IN AERS: INHALATION_SPRAY | RESPIRATORY_TRACT | 3 refills | Status: DC

## 2022-01-18 NOTE — Progress Notes (Signed)
BP 102/68   Pulse 83   Temp 98.2 F (36.8 C)   Wt 164 lb 11.2 oz (74.7 kg)   SpO2 96%   BMI 24.32 kg/m    Subjective:    Patient ID: Nicole Riddle, female    DOB: Jan 30, 2001, 21 y.o.   MRN: [id number]  HPI: Nicole Riddle is a 21 y.o. female  Chief Complaint  Patient presents with   Asthma    Patient states she had an asthma attack on Saturday and went to UC. Patient is now feeling better but is feeling dizzy, and has headache. Patient is currently on prednisone.    UPPER RESPIRATORY TRACT INFECTION Duration: Friday Worst symptom: SOB and wheezing Fever: no Cough: yes Shortness of breath: yes Wheezing: yes Chest pain: yes, with cough Chest tightness: yes Chest congestion: yes Nasal congestion: yes Runny nose: yes Post nasal drip: yes Sneezing: no Sore throat: no Swollen glands: no Sinus pressure: no Headache: no Face pain: no Toothache: no Ear pain: no  Ear pressure: yes bilateral Eyes red/itching:no Eye drainage/crusting: no  Vomiting: no Rash: no Fatigue: yes Sick contacts: no Strep contacts: no  Context: better Recurrent sinusitis: no Relief with OTC cold/cough medications: no  Treatments attempted: prednisone   Relevant past medical, surgical, family and social history reviewed and updated as indicated. Interim medical history since our last visit reviewed. Allergies and medications reviewed and updated.  Review of Systems  Constitutional: Negative.   HENT:  Positive for congestion, postnasal drip and sinus pressure. Negative for dental problem, drooling, ear discharge, ear pain, facial swelling, hearing loss, mouth sores, nosebleeds, rhinorrhea, sinus pain, sneezing, sore throat, tinnitus, trouble swallowing and voice change.   Respiratory:  Positive for cough, chest tightness, shortness of breath and wheezing. Negative for apnea, choking and stridor.   Cardiovascular: Negative.   Gastrointestinal: Negative.   Musculoskeletal: Negative.   Skin:  Negative.   Psychiatric/Behavioral: Negative.      Per HPI unless specifically indicated above     Objective:    BP 102/68   Pulse 83   Temp 98.2 F (36.8 C)   Wt 164 lb 11.2 oz (74.7 kg)   SpO2 96%   BMI 24.32 kg/m   Wt Readings from Last 3 Encounters:  01/18/22 164 lb 11.2 oz (74.7 kg)  09/16/21 161 lb 3.2 oz (73.1 kg)  03/18/21 155 lb (70.3 kg) (84 %, Z= 1.01)*   * Growth percentiles are based on CDC (Girls, 2-20 Years) data.    Physical Exam Vitals and nursing note reviewed.  Constitutional:      General: She is not in acute distress.    Appearance: Normal appearance. She is normal weight. She is not ill-appearing, toxic-appearing or diaphoretic.  HENT:     Head: Normocephalic and atraumatic.     Right Ear: External ear normal.     Left Ear: External ear normal.     Nose: Nose normal.     Mouth/Throat:     Mouth: Mucous membranes are moist.     Pharynx: Oropharynx is clear.  Eyes:     General: No scleral icterus.       Right eye: No discharge.        Left eye: No discharge.     Extraocular Movements: Extraocular movements intact.     Conjunctiva/sclera: Conjunctivae normal.     Pupils: Pupils are equal, round, and reactive to light.  Cardiovascular:     Rate and Rhythm: Normal rate and regular rhythm.  Pulses: Normal pulses.     Heart sounds: Normal heart sounds. No murmur heard.    No friction rub. No gallop.  Pulmonary:     Effort: Pulmonary effort is normal. No respiratory distress.     Breath sounds: No stridor. Wheezing present. No rhonchi or rales.  Chest:     Chest wall: No tenderness.  Musculoskeletal:        General: Normal range of motion.     Cervical back: Normal range of motion and neck supple.  Skin:    General: Skin is warm and dry.     Capillary Refill: Capillary refill takes less than 2 seconds.     Coloration: Skin is not jaundiced or pale.     Findings: No bruising, erythema, lesion or rash.  Neurological:     General: No focal  deficit present.     Mental Status: She is alert and oriented to person, place, and time. Mental status is at baseline.  Psychiatric:        Mood and Affect: Mood normal.        Behavior: Behavior normal.        Thought Content: Thought content normal.        Judgment: Judgment normal.     Results for orders placed or performed in visit on 03/18/21  GC/Chlamydia Probe Amp   Specimen: Urine   UR  Result Value Ref Range   Chlamydia trachomatis, NAA Negative Negative   Neisseria Gonorrhoeae by PCR Negative Negative  Urinalysis, Routine w reflex microscopic  Result Value Ref Range   Specific Gravity, UA 1.020 1.005 - 1.030   pH, UA 6.5 5.0 - 7.5   Color, UA Yellow Yellow   Appearance Ur Clear Clear   Leukocytes,UA Negative Negative   Protein,UA Negative Negative/Trace   Glucose, UA Negative Negative   Ketones, UA Negative Negative   RBC, UA Negative Negative   Bilirubin, UA Negative Negative   Urobilinogen, Ur 0.2 0.2 - 1.0 mg/dL   Nitrite, UA Negative Negative  CBC with Differential/Platelet  Result Value Ref Range   WBC 4.7 3.4 - 10.8 x10E3/uL   RBC 4.67 3.77 - 5.28 x10E6/uL   Hemoglobin 13.6 11.1 - 15.9 g/dL   Hematocrit 42.0 34.0 - 46.6 %   MCV 90 79 - 97 fL   MCH 29.1 26.6 - 33.0 pg   MCHC 32.4 31.5 - 35.7 g/dL   RDW 12.3 11.7 - 15.4 %   Platelets 303 150 - 450 x10E3/uL   Neutrophils 44 Not Estab. %   Lymphs 43 Not Estab. %   Monocytes 8 Not Estab. %   Eos 4 Not Estab. %   Basos 1 Not Estab. %   Neutrophils Absolute 2.0 1.4 - 7.0 x10E3/uL   Lymphocytes Absolute 2.0 0.7 - 3.1 x10E3/uL   Monocytes Absolute 0.4 0.1 - 0.9 x10E3/uL   EOS (ABSOLUTE) 0.2 0.0 - 0.4 x10E3/uL   Basophils Absolute 0.0 0.0 - 0.2 x10E3/uL   Immature Granulocytes 0 Not Estab. %   Immature Grans (Abs) 0.0 0.0 - 0.1 x10E3/uL  Comprehensive metabolic panel  Result Value Ref Range   Glucose 61 (L) 70 - 99 mg/dL   BUN 9 6 - 20 mg/dL   Creatinine, Ser 0.83 0.57 - 1.00 mg/dL   eGFR 104 >59  mL/min/1.73   BUN/Creatinine Ratio 11 9 - 23   Sodium 139 134 - 144 mmol/L   Potassium 4.2 3.5 - 5.2 mmol/L   Chloride 102 96 - 106 mmol/L  CO2 21 20 - 29 mmol/L   Calcium 9.7 8.7 - 10.2 mg/dL   Total Protein 7.1 6.0 - 8.5 g/dL   Albumin 4.7 3.9 - 5.0 g/dL   Globulin, Total 2.4 1.5 - 4.5 g/dL   Albumin/Globulin Ratio 2.0 1.2 - 2.2   Bilirubin Total 0.2 0.0 - 1.2 mg/dL   Alkaline Phosphatase 55 42 - 106 IU/L   AST 13 0 - 40 IU/L   ALT 11 0 - 32 IU/L  Lipid Panel w/o Chol/HDL Ratio  Result Value Ref Range   Cholesterol, Total 140 100 - 169 mg/dL   Triglycerides 79 0 - 89 mg/dL   HDL 47 >39 mg/dL   VLDL Cholesterol Cal 15 5 - 40 mg/dL   LDL Chol Calc (NIH) 78 0 - 109 mg/dL  TSH  Result Value Ref Range   TSH 1.800 0.450 - 4.500 uIU/mL      Assessment & Plan:   Problem List Items Addressed This Visit   None Visit Diagnoses     Mild intermittent asthma with exacerbation    -  Primary   Better after neb- will change dose of prednisone and continue albuterol. Call if not better by Friday and we'll send through azithromycin.    Relevant Medications   albuterol (PROVENTIL) (2.5 MG/3ML) 0.083% nebulizer solution 2.5 mg   predniSONE (DELTASONE) 10 MG tablet   albuterol (VENTOLIN HFA) 108 (90 Base) MCG/ACT inhaler        Follow up plan: Return please move physical to 03/19/22 or later.

## 2022-03-18 ENCOUNTER — Encounter: Payer: Commercial Managed Care - PPO | Admitting: Family Medicine

## 2022-03-24 ENCOUNTER — Encounter: Payer: Commercial Managed Care - PPO | Admitting: Family Medicine

## 2022-03-29 ENCOUNTER — Other Ambulatory Visit: Payer: Self-pay | Admitting: Family Medicine

## 2022-03-29 ENCOUNTER — Encounter: Payer: Self-pay | Admitting: Family Medicine

## 2022-03-29 ENCOUNTER — Ambulatory Visit (INDEPENDENT_AMBULATORY_CARE_PROVIDER_SITE_OTHER): Payer: Commercial Managed Care - PPO | Admitting: Family Medicine

## 2022-03-29 VITALS — BP 98/66 | HR 80 | Temp 98.7°F | Ht 69.0 in | Wt 166.3 lb

## 2022-03-29 DIAGNOSIS — J45990 Exercise induced bronchospasm: Secondary | ICD-10-CM | POA: Diagnosis not present

## 2022-03-29 DIAGNOSIS — F419 Anxiety disorder, unspecified: Secondary | ICD-10-CM

## 2022-03-29 DIAGNOSIS — Z0000 Encounter for general adult medical examination without abnormal findings: Secondary | ICD-10-CM | POA: Diagnosis not present

## 2022-03-29 MED ORDER — SERTRALINE HCL 100 MG PO TABS: 100.0000 mg | ORAL_TABLET | Freq: Every day | ORAL | 1 refills | Status: DC

## 2022-03-29 MED ORDER — NORETHIN ACE-ETH ESTRAD-FE 1-20 MG-MCG PO TABS: 1.0000 | ORAL_TABLET | Freq: Every day | ORAL | 3 refills | Status: DC

## 2022-03-29 MED ORDER — ALBUTEROL SULFATE HFA 108 (90 BASE) MCG/ACT IN AERS: INHALATION_SPRAY | RESPIRATORY_TRACT | 3 refills | Status: DC

## 2022-03-29 NOTE — Telephone Encounter (Signed)
Requested medications are due for refill today.  unsure  Requested medications are on the active medications list.    Last refill. 03/29/2022  Future visit scheduled.   yes  Notes to clinic.  Pharmacy comment: Alternative Requested:THE PRESCRIBED MEDICATION IS NOT COVERED BY INSURANCE. PLEASE CONSIDER CHANGING TO ONE OF THE SUGGESTED COVERED ALTERNATIVES.     Requested Prescriptions  Pending Prescriptions Disp Refills   levalbuterol (XOPENEX HFA) 45 MCG/ACT inhaler [Pharmacy Med Name: LEVALBUTEROL TAR HFA INH]  0     Pulmonology:  [facility] 2 Passed - 03/29/2022  4:19 PM      Passed - Last BP in normal range    BP Readings from Last 1 Encounters:  03/29/22 98/66         Passed - Last Heart Rate in normal range    Pulse Readings from Last 1 Encounters:  03/29/22 80         Passed - Valid encounter within last 12 months    Recent Outpatient Visits           Today Routine general medical examination at a health care facility   [facility], [place] P, DO   2 months ago Mild intermittent asthma with exacerbation   [facility] [place], Megan P, DO   6 months ago Anxiety   [facility] [place], [place], DO   1 year ago Routine general medical examination at a health care facility   [facility] [place], [place] P, DO   1 year ago Encounter for initial prescription of contraceptive pills   [facility] [place], Oralia Rud, DO       Future Appointments             In 1 year Laural Benes, Oralia Rud, DO [employer]

## 2022-03-29 NOTE — Assessment & Plan Note (Signed)
Under good control on current regimen. Continue current regimen. Continue to monitor. Call with any concerns. Refills given.   

## 2022-03-29 NOTE — Progress Notes (Signed)
BP 98/66   Pulse 80   Temp 98.7 F (37.1 C) (Oral)   Ht _0  (1.753 m)   Wt 166 lb 4.8 oz (75.4 kg)   LMP 03/27/2022 (Exact Date)   SpO2 99%   BMI 24.56 kg/m    Subjective:    Patient ID: Nicole Riddle, female    DOB: 19-Nov-2000, 21 y.o.   MRN: [id number]  HPI: Nicole Riddle is a 20 y.o. female presenting on 03/29/2022 for comprehensive medical examination. Current medical complaints include:  ANXIETY/STRESS Duration: chronic Stable:stable Anxious mood: no  Excessive worrying: no Irritability: no  Sweating: no Nausea: no Palpitations:no Hyperventilation: no Panic attacks: no Agoraphobia: no  Obscessions/compulsions: no Depressed mood: no    03/29/2022    2:52 PM 01/18/2022    2:46 PM 09/16/2021    8:26 AM 03/18/2021    1:23 PM 11/14/2019   10:58 AM  Depression screen PHQ 2/9  Decreased Interest 0 0 0 0 0  Down, Depressed, Hopeless 0 0 0 0 0  PHQ - 2 Score 0 0 0 0 0  Altered sleeping _1 0  Tired, decreased energy _2 0  Change in appetite 0 0 0 2 0  Feeling bad or failure about yourself  0 0 0 0 1  Trouble concentrating 0 0 0 0 0  Moving slowly or fidgety/restless 0 0 0 0 0  Suicidal thoughts 0 0 0 0 0  PHQ-9 Score _3 Difficult doing work/chores Somewhat difficult Not difficult at all   Not difficult at all   Anhedonia: no Weight changes: no Insomnia: no   Hypersomnia: no Fatigue/loss of energy: no Feelings of worthlessness: no Feelings of guilt: no Impaired concentration/indecisiveness: no Suicidal ideations: no  Crying spells: no Recent Stressors/Life Changes: no   Relationship problems: no   Family stress: no     Financial stress: no    Job stress: no    Recent death/loss: no  She currently lives with: parents and siblings Menopausal Symptoms: no  Depression Screen done today and results listed below:     03/29/2022    2:52 PM 01/18/2022    2:46 PM 09/16/2021    8:26 AM 03/18/2021    1:23 PM 11/14/2019   10:58 AM  Depression  screen PHQ 2/9  Decreased Interest 0 0 0 0 0  Down, Depressed, Hopeless 0 0 0 0 0  PHQ - 2 Score 0 0 0 0 0  Altered sleeping _4 0  Tired, decreased energy _5 0  Change in appetite 0 0 0 2 0  Feeling bad or failure about yourself  0 0 0 0 1  Trouble concentrating 0 0 0 0 0  Moving slowly or fidgety/restless 0 0 0 0 0  Suicidal thoughts 0 0 0 0 0  PHQ-9 Score _6 Difficult doing work/chores Somewhat difficult Not difficult at all   Not difficult at all    Past Medical History:  Past Medical History:  Diagnosis Date   Plantar wart    Psoriasis     Surgical History:  Past Surgical History:  Procedure Laterality Date   none      Medications:  No current outpatient medications on file prior to visit.   Current Facility-Administered Medications on File Prior to Visit  Medication   albuterol (PROVENTIL) (2.5 MG/3ML) 0.083% nebulizer solution 2.5 mg    Allergies:  No Known Allergies  Social History:  Social History   Socioeconomic History   Marital status: Single    Spouse name: Not on file   Number of children: Not on file   Years of education: Not on file   Highest education level: Not on file  Occupational History   Not on file  Tobacco Use   Smoking status: Never   Smokeless tobacco: Never  Vaping Use   Vaping Use: Every day  Substance and Sexual Activity   Alcohol use: No    Alcohol/week: 0.0 standard drinks of alcohol   Drug use: No   Sexual activity: Yes    Birth control/protection: Pill  Other Topics Concern   Not on file  Social History Narrative   Not on file   Social Determinants of Health   Financial Resource Strain: Not on file  Food Insecurity: Not on file  Transportation Needs: Not on file  Physical Activity: Not on file  Stress: Not on file  Social Connections: Not on file  Intimate Partner Violence: Not on file   Social History   Tobacco Use  Smoking Status Never  Smokeless Tobacco Never   Social History    Substance and Sexual Activity  Alcohol Use No   Alcohol/week: 0.0 standard drinks of alcohol    Family History:  Family History  Problem Relation Age of Onset   Thyroid disease Mother    Asthma Sister    Asthma Brother     Past medical history, surgical history, medications, allergies, family history and social history reviewed with patient today and changes made to appropriate areas of the chart.   Review of Systems  Constitutional: Negative.   HENT: Negative.    Respiratory: Negative.    Cardiovascular: Negative.   Gastrointestinal: Negative.   Genitourinary: Negative.   Musculoskeletal: Negative.   Skin: Negative.   Neurological: Negative.   Endo/Heme/Allergies: Negative.   Psychiatric/Behavioral: Negative.     All other ROS negative except what is listed above and in the HPI.      Objective:    BP 98/66   Pulse 80   Temp 98.7 F (37.1 C) (Oral)   Ht _0  (1.753 m)   Wt 166 lb 4.8 oz (75.4 kg)   LMP 03/27/2022 (Exact Date)   SpO2 99%   BMI 24.56 kg/m   Wt Readings from Last 3 Encounters:  03/29/22 166 lb 4.8 oz (75.4 kg)  01/18/22 164 lb 11.2 oz (74.7 kg)  09/16/21 161 lb 3.2 oz (73.1 kg)    Physical Exam Vitals and nursing note reviewed.  Constitutional:      General: She is not in acute distress.    Appearance: Normal appearance. She is normal weight. She is not ill-appearing, toxic-appearing or diaphoretic.  HENT:     Head: Normocephalic and atraumatic.     Right Ear: Tympanic membrane, ear canal and external ear normal. There is no impacted cerumen.     Left Ear: Tympanic membrane, ear canal and external ear normal. There is no impacted cerumen.     Nose: Nose normal. No congestion or rhinorrhea.     Mouth/Throat:     Mouth: Mucous membranes are moist.     Pharynx: Oropharynx is clear. No oropharyngeal exudate or posterior oropharyngeal erythema.  Eyes:     General: No scleral icterus.       Right eye: No discharge.        Left eye: No  discharge.     Extraocular Movements:  Extraocular movements intact.     Conjunctiva/sclera: Conjunctivae normal.     Pupils: Pupils are equal, round, and reactive to light.  Neck:     Vascular: No carotid bruit.  Cardiovascular:     Rate and Rhythm: Normal rate and regular rhythm.     Pulses: Normal pulses.     Heart sounds: No murmur heard.    No friction rub. No gallop.  Pulmonary:     Effort: Pulmonary effort is normal. No respiratory distress.     Breath sounds: Normal breath sounds. No stridor. No wheezing, rhonchi or rales.  Chest:     Chest wall: No tenderness.  Abdominal:     General: Abdomen is flat. Bowel sounds are normal. There is no distension.     Palpations: Abdomen is soft. There is no mass.     Tenderness: There is no abdominal tenderness. There is no right CVA tenderness, left CVA tenderness, guarding or rebound.     Hernia: No hernia is present.  Genitourinary:    Comments: Breast and pelvic exams deferred with shared decision making Musculoskeletal:        General: No swelling, tenderness, deformity or signs of injury.     Cervical back: Normal range of motion and neck supple. No rigidity. No muscular tenderness.     Right lower leg: No edema.     Left lower leg: No edema.  Lymphadenopathy:     Cervical: No cervical adenopathy.  Skin:    General: Skin is warm and dry.     Capillary Refill: Capillary refill takes less than 2 seconds.     Coloration: Skin is not jaundiced or pale.     Findings: No bruising, erythema, lesion or rash.  Neurological:     General: No focal deficit present.     Mental Status: She is alert and oriented to person, place, and time. Mental status is at baseline.     Cranial Nerves: No cranial nerve deficit.     Sensory: No sensory deficit.     Motor: No weakness.     Coordination: Coordination normal.     Gait: Gait normal.     Deep Tendon Reflexes: Reflexes normal.  Psychiatric:        Mood and Affect: Mood normal.         Behavior: Behavior normal.        Thought Content: Thought content normal.        Judgment: Judgment normal.     Results for orders placed or performed in visit on 03/18/21  GC/Chlamydia Probe Amp   Specimen: Urine   UR  Result Value Ref Range   Chlamydia trachomatis, NAA Negative Negative   Neisseria Gonorrhoeae by PCR Negative Negative  Urinalysis, Routine w reflex microscopic  Result Value Ref Range   Specific Gravity, UA 1.020 1.005 - 1.030   pH, UA 6.5 5.0 - 7.5   Color, UA Yellow Yellow   Appearance Ur Clear Clear   Leukocytes,UA Negative Negative   Protein,UA Negative Negative/Trace   Glucose, UA Negative Negative   Ketones, UA Negative Negative   RBC, UA Negative Negative   Bilirubin, UA Negative Negative   Urobilinogen, Ur 0.2 0.2 - 1.0 mg/dL   Nitrite, UA Negative Negative  CBC with Differential/Platelet  Result Value Ref Range   WBC 4.7 3.4 - 10.8 x10E3/uL   RBC 4.67 3.77 - 5.28 x10E6/uL   Hemoglobin 13.6 11.1 - 15.9 g/dL   Hematocrit 42.0 34.0 - 46.6 %  MCV 90 79 - 97 fL   MCH 29.1 26.6 - 33.0 pg   MCHC 32.4 31.5 - 35.7 g/dL   RDW 12.3 11.7 - 15.4 %   Platelets 303 150 - 450 x10E3/uL   Neutrophils 44 Not Estab. %   Lymphs 43 Not Estab. %   Monocytes 8 Not Estab. %   Eos 4 Not Estab. %   Basos 1 Not Estab. %   Neutrophils Absolute 2.0 1.4 - 7.0 x10E3/uL   Lymphocytes Absolute 2.0 0.7 - 3.1 x10E3/uL   Monocytes Absolute 0.4 0.1 - 0.9 x10E3/uL   EOS (ABSOLUTE) 0.2 0.0 - 0.4 x10E3/uL   Basophils Absolute 0.0 0.0 - 0.2 x10E3/uL   Immature Granulocytes 0 Not Estab. %   Immature Grans (Abs) 0.0 0.0 - 0.1 x10E3/uL  Comprehensive metabolic panel  Result Value Ref Range   Glucose 61 (L) 70 - 99 mg/dL   BUN 9 6 - 20 mg/dL   Creatinine, Ser 0.83 0.57 - 1.00 mg/dL   eGFR 104 >59 mL/min/1.73   BUN/Creatinine Ratio 11 9 - 23   Sodium 139 134 - 144 mmol/L   Potassium 4.2 3.5 - 5.2 mmol/L   Chloride 102 96 - 106 mmol/L   CO2 21 20 - 29 mmol/L   Calcium 9.7 8.7  - 10.2 mg/dL   Total Protein 7.1 6.0 - 8.5 g/dL   Albumin 4.7 3.9 - 5.0 g/dL   Globulin, Total 2.4 1.5 - 4.5 g/dL   Albumin/Globulin Ratio 2.0 1.2 - 2.2   Bilirubin Total 0.2 0.0 - 1.2 mg/dL   Alkaline Phosphatase 55 42 - 106 IU/L   AST 13 0 - 40 IU/L   ALT 11 0 - 32 IU/L  Lipid Panel w/o Chol/HDL Ratio  Result Value Ref Range   Cholesterol, Total 140 100 - 169 mg/dL   Triglycerides 79 0 - 89 mg/dL   HDL 47 >39 mg/dL   VLDL Cholesterol Cal 15 5 - 40 mg/dL   LDL Chol Calc (NIH) 78 0 - 109 mg/dL  TSH  Result Value Ref Range   TSH 1.800 0.450 - 4.500 uIU/mL      Assessment & Plan:   Problem List Items Addressed This Visit       Respiratory   Exercise-induced asthma    Under good control on current regimen. Continue current regimen. Continue to monitor. Call with any concerns. Refills given.        Relevant Medications   albuterol (VENTOLIN HFA) 108 (90 Base) MCG/ACT inhaler     Other   Anxiety    Under good control on current regimen. Continue current regimen. Continue to monitor. Call with any concerns. Refills given.        Relevant Medications   sertraline (ZOLOFT) 100 MG tablet   Other Visit Diagnoses     Routine general medical examination at a health care facility    -  Primary   Vaccines up to date/declined. Screening labs checked today. Continue diet and exercise. Call with any concerns. Continue to monitor.   Relevant Orders   Hepatitis B surface antibody,quantitative   Varicella-Zoster Virus Antibody (Immunity Screen), ACIF ,Serum   Measles/Mumps/Rubella Immunity   GC/Chlamydia Probe Amp   CBC with Differential/Platelet   Comprehensive metabolic panel   Lipid Panel w/o Chol/HDL Ratio   Urinalysis, Routine w reflex microscopic   TSH        Follow up plan: Return in about 1 year (around 03/30/2023) for physical.   LABORATORY TESTING:  -  Pap smear: not applicable  IMMUNIZATIONS:   - Tdap: Tetanus vaccination status reviewed: last tetanus  booster within 10 years. - Influenza: Refused - Pneumovax: Not applicable - Prevnar: Up to date - COVID: Refused - HPV: Refused - Shingrix vaccine: Not applicable   PATIENT COUNSELING:   Advised to take 1 mg of folate supplement per day if capable of pregnancy.   Sexuality: Discussed sexually transmitted diseases, partner selection, use of condoms, avoidance of unintended pregnancy  and contraceptive alternatives.   Advised to avoid cigarette smoking.  I discussed with the patient that most people either abstain from alcohol or drink within safe limits (<=14/week and <=4 drinks/occasion for males, <=7/weeks and <= 3 drinks/occasion for females) and that the risk for alcohol disorders and other health effects rises proportionally with the number of drinks per week and how often a drinker exceeds daily limits.  Discussed cessation/primary prevention of drug use and availability of treatment for abuse.   Diet: Encouraged to adjust caloric intake to maintain  or achieve ideal body weight, to reduce intake of dietary saturated fat and total fat, to limit sodium intake by avoiding high sodium foods and not adding table salt, and to maintain adequate dietary potassium and calcium preferably from fresh fruits, vegetables, and low-fat dairy products.    stressed the importance of regular exercise  Injury prevention: Discussed safety belts, safety helmets, smoke detector, smoking near bedding or upholstery.   Dental health: Discussed importance of regular tooth brushing, flossing, and dental visits.    NEXT PREVENTATIVE PHYSICAL DUE IN 1 YEAR. Return in about 1 year (around 03/30/2023) for physical.

## 2022-03-30 LAB — COMPREHENSIVE METABOLIC PANEL
ALT: 30 IU/L (ref 0–32)
AST: 25 IU/L (ref 0–40)
Albumin/Globulin Ratio: 2.1 (ref 1.2–2.2)
Albumin: 4.8 g/dL (ref 4.0–5.0)
Alkaline Phosphatase: 67 IU/L (ref 42–106)
BUN/Creatinine Ratio: 12 (ref 9–23)
BUN: 10 mg/dL (ref 6–20)
Bilirubin Total: 0.2 mg/dL (ref 0.0–1.2)
CO2: 22 mmol/L (ref 20–29)
Calcium: 9.6 mg/dL (ref 8.7–10.2)
Chloride: 103 mmol/L (ref 96–106)
Creatinine, Ser: 0.84 mg/dL (ref 0.57–1.00)
Globulin, Total: 2.3 g/dL (ref 1.5–4.5)
Glucose: 74 mg/dL (ref 70–99)
Potassium: 3.9 mmol/L (ref 3.5–5.2)
Sodium: 140 mmol/L (ref 134–144)
Total Protein: 7.1 g/dL (ref 6.0–8.5)
eGFR: 102 mL/min/{1.73_m2} (ref >59)

## 2022-03-30 LAB — CBC WITH DIFFERENTIAL/PLATELET
Basophils Absolute: 0.1 10*3/uL (ref 0.0–0.2)
Basos: 1 %
EOS (ABSOLUTE): 0.5 10*3/uL — ABNORMAL HIGH (ref 0.0–0.4)
Eos: 7 %
Hematocrit: 41.5 % (ref 34.0–46.6)
Hemoglobin: 14.1 g/dL (ref 11.1–15.9)
Immature Grans (Abs): 0 10*3/uL (ref 0.0–0.1)
Immature Granulocytes: 0 %
Lymphocytes Absolute: 2.1 10*3/uL (ref 0.7–3.1)
Lymphs: 33 %
MCH: 30.2 pg (ref 26.6–33.0)
MCHC: 34 g/dL (ref 31.5–35.7)
MCV: 89 fL (ref 79–97)
Monocytes Absolute: 0.4 10*3/uL (ref 0.1–0.9)
Monocytes: 6 %
Neutrophils Absolute: 3.3 10*3/uL (ref 1.4–7.0)
Neutrophils: 53 %
Platelets: 332 10*3/uL (ref 150–450)
RBC: 4.67 x10E6/uL (ref 3.77–5.28)
RDW: 12.6 % (ref 11.7–15.4)
WBC: 6.4 10*3/uL (ref 3.4–10.8)

## 2022-03-30 LAB — MICROSCOPIC EXAMINATION
Casts: NONE SEEN /lpf
RBC, Urine: NONE SEEN /hpf (ref 0–2)

## 2022-03-30 LAB — LIPID PANEL W/O CHOL/HDL RATIO
Cholesterol, Total: 173 mg/dL (ref 100–199)
HDL: 52 mg/dL (ref >39)
LDL Chol Calc (NIH): 98 mg/dL (ref 0–99)
Triglycerides: 133 mg/dL (ref 0–149)
VLDL Cholesterol Cal: 23 mg/dL (ref 5–40)

## 2022-03-30 LAB — MEASLES/MUMPS/RUBELLA IMMUNITY
MUMPS ABS, IGG: 185 AU/mL (ref >10.9)
RUBEOLA AB, IGG: 37.4 AU/mL (ref >16.4)
Rubella Antibodies, IGG: 4.33 index (ref >0.99)

## 2022-03-30 LAB — URINALYSIS, ROUTINE W REFLEX MICROSCOPIC
Bilirubin, UA: NEGATIVE
Glucose, UA: NEGATIVE
Ketones, UA: NEGATIVE
Nitrite, UA: NEGATIVE
Protein,UA: NEGATIVE
RBC, UA: NEGATIVE
Specific Gravity, UA: 1.019 (ref 1.005–1.030)
Urobilinogen, Ur: 0.2 mg/dL (ref 0.2–1.0)
pH, UA: 5.5 (ref 5.0–7.5)

## 2022-03-30 LAB — HEPATITIS B SURFACE ANTIBODY, QUANTITATIVE: Hepatitis B Surf Ab Quant: 75.6 m[IU]/mL (ref >9.9)

## 2022-03-30 LAB — TSH: TSH: 1.02 u[IU]/mL (ref 0.450–4.500)

## 2022-03-30 NOTE — Addendum Note (Signed)
Addended by: Dorcas Carrow on: 03/30/2022 09:01 AM   Modules accepted: Orders

## 2022-03-31 LAB — VARICELLA ZOSTER ANTIBODY, IGG: Varicella zoster IgG: 199 index (ref >165)

## 2022-04-01 LAB — GC/CHLAMYDIA PROBE AMP
Chlamydia trachomatis, NAA: NEGATIVE
Neisseria Gonorrhoeae by PCR: NEGATIVE

## 2022-07-29 ENCOUNTER — Telehealth: Payer: Self-pay | Admitting: Family Medicine

## 2022-07-29 MED ORDER — ALBUTEROL SULFATE HFA 108 (90 BASE) MCG/ACT IN AERS: 2.0000 | INHALATION_SPRAY | Freq: Four times a day (QID) | RESPIRATORY_TRACT | 0 refills | Status: DC | PRN

## 2022-07-29 NOTE — Telephone Encounter (Signed)
Patient has not been able to get her Rx- pharmacy does not have. Patient wants to know if there is an alternative- she has not had medication in 3 weeks

## 2022-07-29 NOTE — Telephone Encounter (Signed)
Medication Refill - Medication: levalbuterol (XOPENEX HFA) 45 MCG/ACT inhaler Pt has been out of medication for 3 weeks.   Has the patient contacted their pharmacy? Yes.   Pt states that she has called the pharmacy, the pharmacy is out of the medication and has been out for 3 weeks and do not know when they will get any in. Pt is wanting to know if she can get something else called in.    Preferred Pharmacy (with phone number or street name): [id number] [place], [place] - [address] AT [facility]  Phone: [phone] Fax: [phone]  Has the patient been seen for an appointment in the last year OR does the patient have an upcoming appointment? Yes.    Agent: Please be advised that RX refills may take up to 3 business days. We ask that you follow-up with your pharmacy.

## 2022-07-29 NOTE — Telephone Encounter (Signed)
Called and spoke with patient's pharmacy to follow up with patient's requested prescription and was told by the pharmacy that they would need a new Rx. As the one that was sent back in December may have been loss in transmitting. Pharmacist says she can not take a verbal and would need a new Rx sent to patient's pharmacy. Please advise?

## 2023-03-02 ENCOUNTER — Ambulatory Visit
Admission: EM | Admit: 2023-03-02 | Discharge: 2023-03-02 | Disposition: A | Discharge status: Home / Self Care | Payer: Commercial Managed Care - PPO | Attending: Emergency Medicine | Admitting: Emergency Medicine

## 2023-03-02 DIAGNOSIS — H6501 Acute serous otitis media, right ear: Secondary | ICD-10-CM | POA: Diagnosis not present

## 2023-03-02 MED ORDER — AMOXICILLIN-POT CLAVULANATE 875-125 MG PO TABS: 1.0000 | ORAL_TABLET | Freq: Two times a day (BID) | ORAL | 0 refills | Status: DC

## 2023-03-02 NOTE — ED Provider Notes (Signed)
[name]    CSN: [id number] Arrival date & time: 03/02/23  6213      History   Chief Complaint Chief Complaint  Patient presents with   Otalgia    HPI Nicole Riddle is a 22 y.o. female.   Patient presents for evaluation of nasal congestion, rhinorrhea, productive cough, sinus pain and pressure affecting the right side and right-sided ear pain and fullness present for 7 days.  Right ear worse than left.  Tolerating food and liquids.  Known sick contacts prior.  Has attempted use of a old eardrop prescription, ibuprofen and DayQuil.  Denies fever.  Past Medical History:  Diagnosis Date   Plantar wart    Psoriasis     Patient Active Problem List   Diagnosis Date Noted   Anxiety 12/26/2018   Exercise-induced asthma 12/20/2016   Plantar wart    Allergy to cats 04/01/2015   Psoriasis 04/01/2015    Past Surgical History:  Procedure Laterality Date   none      OB History   No obstetric history on file.      Home Medications    Prior to Admission medications   Medication Sig Start Date End Date Taking? Authorizing Provider  amoxicillin-clavulanate (AUGMENTIN) 875-125 MG tablet Take 1 tablet by mouth every 12 (twelve) hours. 03/02/23  Yes Eymi Lipuma R, NP  albuterol (VENTOLIN HFA) 108 (90 Base) MCG/ACT inhaler Inhale 2 puffs into the lungs every 6 (six) hours as needed for wheezing or shortness of breath. 07/29/22   Johnson, Megan P, DO  levalbuterol (XOPENEX HFA) 45 MCG/ACT inhaler Inhale 1-2 puffs into the lungs every 6 (six) hours as needed for wheezing. 03/30/22   Olevia Perches P, DO  norethindrone-ethinyl estradiol-FE (JUNEL FE 1/20) 1-20 MG-MCG tablet Take 1 tablet by mouth daily. 03/29/22   Johnson, Megan P, DO  sertraline (ZOLOFT) 100 MG tablet Take 1 tablet (100 mg total) by mouth daily. 03/29/22   Dorcas Carrow, DO    Family History Family History  Problem Relation Age of Onset   Thyroid disease Mother    Asthma Sister    Asthma  Brother     Social History Social History   Tobacco Use   Smoking status: Never   Smokeless tobacco: Never  Vaping Use   Vaping status: Every Day  Substance Use Topics   Alcohol use: No    Alcohol/week: 0.0 standard drinks of alcohol   Drug use: No     Allergies   Patient has no known allergies.   Review of Systems Review of Systems   Physical Exam Triage Vital Signs ED Triage Vitals [03/02/23 1045]  Encounter Vitals Group     BP      Systolic BP Percentile      Diastolic BP Percentile      Pulse      Resp      Temp      Temp src      SpO2      Weight      Height      Head Circumference      Peak Flow      Pain Score 3     Pain Loc      Pain Education      Exclude from Growth Chart    No data found.  Updated Vital Signs LMP 02/15/2023 (Approximate)   Visual Acuity Right Eye Distance:   Left Eye Distance:   Bilateral Distance:    Right Eye Near:  Left Eye Near:    Bilateral Near:     Physical Exam Constitutional:      Appearance: Normal appearance.  HENT:     Head: Normocephalic.     Right Ear: Hearing, ear canal and external ear normal. Tympanic membrane is erythematous.     Left Ear: Hearing, tympanic membrane, ear canal and external ear normal.     Nose: Congestion present. No rhinorrhea.     Mouth/Throat:     Mouth: Mucous membranes are moist.     Pharynx: Oropharynx is clear. No oropharyngeal exudate or posterior oropharyngeal erythema.  Eyes:     Extraocular Movements: Extraocular movements intact.  Cardiovascular:     Rate and Rhythm: Normal rate and regular rhythm.     Pulses: Normal pulses.     Heart sounds: Normal heart sounds.  Pulmonary:     Effort: Pulmonary effort is normal.     Breath sounds: Normal breath sounds.  Musculoskeletal:     Cervical back: Normal range of motion and neck supple.  Neurological:     Mental Status: She is alert and oriented to person, place, and time. Mental status is at baseline.      UC  Treatments / Results  Labs (all labs ordered are listed, but only abnormal results are displayed) Labs Reviewed - No data to display  EKG   Radiology No results found.  Procedures Procedures (including critical care time)  Medications Ordered in UC Medications - No data to display  Initial Impression / Assessment and Plan / UC Course  I have reviewed the triage vital signs and the nursing notes.  Pertinent labs & imaging results that were available during my care of the patient were reviewed by me and considered in my medical decision making (see chart for details).  Non Recurrent acute serous otitis media of the right ear  Presentation consistent with infection, discussed with patient, Augmentin prescribed and recommended over-the-counter analgesics and additional medications for congestion such as mucolytic's and decongestants advised against ear cleaning and advised follow-up if symptoms persist or worsen Final Clinical Impressions(s) / UC Diagnoses   Final diagnoses:  Non-recurrent acute serous otitis media of right ear     Discharge Instructions      Today you are being treated for an infection of the eardrum  Take Augmentin twice daily for 7 days, you should begin to see improvement after 48 hours of medication use and then it should progressively get better  You may use Tylenol or ibuprofen for management of discomfort  May hold warm compresses to the ear for additional comfort  Please not attempted any ear cleaning or object or fluid placement into the ear canal to prevent further irritation   For cough: honey 1/2 to 1 teaspoon (you can dilute the honey in water or another fluid).  You can also use guaifenesin and dextromethorphan for cough. You can use a humidifier for chest congestion and cough.  If you don't have a humidifier, you can sit in the bathroom with the hot shower running.      For sore throat: try warm salt water gargles, cepacol lozenges, throat  spray, warm tea or water with lemon/honey, popsicles or ice, or OTC cold relief medicine for throat discomfort.   For congestion: take a daily anti-histamine like Zyrtec, Claritin, and a oral decongestant, such as pseudoephedrine.  You can also use Flonase 1-2 sprays in each nostril daily.   It is important to stay hydrated: drink plenty of fluids (water,  gatorade/powerade/pedialyte, juices, or teas) to keep your throat moisturized and help further relieve irritation/discomfort.     ED Prescriptions     Medication Sig Dispense Auth. Provider   amoxicillin-clavulanate (AUGMENTIN) 875-125 MG tablet Take 1 tablet by mouth every 12 (twelve) hours. 14 tablet Layal Javid, Elita Boone, NP      PDMP not reviewed this encounter.   Valinda Hoar, NP 03/02/23 1123

## 2023-03-02 NOTE — Discharge Instructions (Signed)
Today you are being treated for an infection of the eardrum  Take Augmentin twice daily for 7 days, you should begin to see improvement after 48 hours of medication use and then it should progressively get better  You may use Tylenol or ibuprofen for management of discomfort  May hold warm compresses to the ear for additional comfort  Please not attempted any ear cleaning or object or fluid placement into the ear canal to prevent further irritation   For cough: honey 1/2 to 1 teaspoon (you can dilute the honey in water or another fluid).  You can also use guaifenesin and dextromethorphan for cough. You can use a humidifier for chest congestion and cough.  If you don't have a humidifier, you can sit in the bathroom with the hot shower running.      For sore throat: try warm salt water gargles, cepacol lozenges, throat spray, warm tea or water with lemon/honey, popsicles or ice, or OTC cold relief medicine for throat discomfort.   For congestion: take a daily anti-histamine like Zyrtec, Claritin, and a oral decongestant, such as pseudoephedrine.  You can also use Flonase 1-2 sprays in each nostril daily.   It is important to stay hydrated: drink plenty of fluids (water, gatorade/powerade/pedialyte, juices, or teas) to keep your throat moisturized and help further relieve irritation/discomfort.

## 2023-03-02 NOTE — ED Triage Notes (Signed)
Patient presents to [facility] for bilateral ear pain x 1 week. Treating with leftover antibiotic ear drops.

## 2023-03-30 ENCOUNTER — Other Ambulatory Visit: Payer: Self-pay | Admitting: Family Medicine

## 2023-03-31 ENCOUNTER — Encounter: Payer: Commercial Managed Care - PPO | Admitting: Family Medicine

## 2023-03-31 NOTE — Telephone Encounter (Signed)
Requested Prescriptions  Pending Prescriptions Disp Refills   levalbuterol (XOPENEX HFA) 45 MCG/ACT inhaler [Pharmacy Med Name: LEVALBUTEROL TAR HFA INH] 15 each 2    Sig: INHALE 1 TO 2 PUFFS BY MOUTH EVERY 6 HOURS AS NEEDED FOR WHEEZE     Pulmonology:  Beta Agonists 2 Failed - 03/31/2023  8:02 AM      Failed - Valid encounter within last 12 months    Recent Outpatient Visits           1 year ago Routine general medical examination at a health care facility   [place] [place], [place] P, DO   1 year ago Mild intermittent asthma with exacerbation   [place] [facility] [place], Oralia Rud, DO   1 year ago Anxiety   [facility] [place], [place] P, DO   2 years ago Routine general medical examination at a health care facility   [facility] [place], [place] P, DO   2 years ago Encounter for initial prescription of contraceptive pills   [place] [facility] [place], [place], DO       Future Appointments             In 4 days Laural Benes, Oralia Rud, DO [place] [facility]            Passed - Last BP in normal range    BP Readings from Last 1 Encounters:  03/29/22 98/66         Passed - Last Heart Rate in normal range    Pulse Readings from Last 1 Encounters:  03/29/22 80

## 2023-04-04 ENCOUNTER — Other Ambulatory Visit (HOSPITAL_COMMUNITY)
Admission: RE | Admit: 2023-04-04 | Discharge: 2023-04-04 | Disposition: A | Discharge status: Home / Self Care | Payer: Commercial Managed Care - PPO | Source: Ambulatory Visit | Attending: Family Medicine | Admitting: Family Medicine

## 2023-04-04 ENCOUNTER — Encounter: Payer: Self-pay | Admitting: Family Medicine

## 2023-04-04 ENCOUNTER — Ambulatory Visit: Payer: Commercial Managed Care - PPO | Admitting: Family Medicine

## 2023-04-04 VITALS — BP 102/72 | HR 114 | Ht 69.0 in | Wt 159.6 lb

## 2023-04-04 DIAGNOSIS — Z23 Encounter for immunization: Secondary | ICD-10-CM | POA: Diagnosis not present

## 2023-04-04 DIAGNOSIS — Z0000 Encounter for general adult medical examination without abnormal findings: Secondary | ICD-10-CM

## 2023-04-04 DIAGNOSIS — F419 Anxiety disorder, unspecified: Secondary | ICD-10-CM

## 2023-04-04 MED ORDER — LEVALBUTEROL TARTRATE 45 MCG/ACT IN AERO: 1.0000 | INHALATION_SPRAY | Freq: Four times a day (QID) | RESPIRATORY_TRACT | 2 refills | Status: DC | PRN

## 2023-04-04 MED ORDER — NORETHIN ACE-ETH ESTRAD-FE 1-20 MG-MCG PO TABS: 1.0000 | ORAL_TABLET | Freq: Every day | ORAL | 3 refills | Status: DC

## 2023-04-04 NOTE — Assessment & Plan Note (Signed)
Doing well off medicine. Continue to monitor. Call with any concerns.  

## 2023-04-04 NOTE — Progress Notes (Signed)
BP 102/72   Pulse (!) 114   Ht 5\' 9"  (1.753 m)   Wt 159 lb 9.6 oz (72.4 kg)   LMP 03/06/2023 (Approximate)   SpO2 99%   BMI 23.57 kg/m    Subjective:    Patient ID: Nicole Riddle, female    DOB: Jan 01, 2001, 22 y.o.   MRN: [id number]  HPI: Nicole Riddle is a 22 y.o. female presenting on 04/04/2023 for comprehensive medical examination. Current medical complaints include:  ANXIETY/STRESS Duration:controlled Anxious mood: no  Excessive worrying: no Irritability: no  Sweating: no Nausea: no Palpitations:no Hyperventilation: no Panic attacks: no Agoraphobia: no  Obscessions/compulsions: no Depressed mood: no    04/04/2023    3:48 PM 03/29/2022    2:52 PM 01/18/2022    2:46 PM 09/16/2021    8:26 AM 03/18/2021    1:23 PM  Depression screen PHQ 2/9  Decreased Interest 1 0 0 0 0  Down, Depressed, Hopeless 1 0 0 0 0  PHQ - 2 Score 2 0 0 0 0  Altered sleeping 0 3 1 3 2   Tired, decreased energy 0 3 1 3 2   Change in appetite 0 0 0 0 2  Feeling bad or failure about yourself  0 0 0 0 0  Trouble concentrating 0 0 0 0 0  Moving slowly or fidgety/restless 0 0 0 0 0  Suicidal thoughts 0 0 0 0 0  PHQ-9 Score 2 6 2 6 6   Difficult doing work/chores Not difficult at all Somewhat difficult Not difficult at all     Anhedonia: no Weight changes: no Insomnia: no   Hypersomnia: no Fatigue/loss of energy: no Feelings of worthlessness: no Feelings of guilt: no Impaired concentration/indecisiveness: no Suicidal ideations: no  Crying spells: no Recent Stressors/Life Changes: no   Relationship problems: no   Family stress: no     Financial stress: no    Job stress: no    Recent death/loss: no  Menopausal Symptoms: no  Depression Screen done today and results listed below:     04/04/2023    3:48 PM 03/29/2022    2:52 PM 01/18/2022    2:46 PM 09/16/2021    8:26 AM 03/18/2021    1:23 PM  Depression screen PHQ 2/9  Decreased Interest 1 0 0 0 0  Down, Depressed, Hopeless 1 0 0 0 0  PHQ  - 2 Score 2 0 0 0 0  Altered sleeping 0 3 1 3 2   Tired, decreased energy 0 3 1 3 2   Change in appetite 0 0 0 0 2  Feeling bad or failure about yourself  0 0 0 0 0  Trouble concentrating 0 0 0 0 0  Moving slowly or fidgety/restless 0 0 0 0 0  Suicidal thoughts 0 0 0 0 0  PHQ-9 Score 2 6 2 6 6   Difficult doing work/chores Not difficult at all Somewhat difficult Not difficult at all      Past Medical History:  Past Medical History:  Diagnosis Date   Plantar wart    Psoriasis     Surgical History:  Past Surgical History:  Procedure Laterality Date   none      Medications:  No current outpatient medications on file prior to visit.   Current Facility-Administered Medications on File Prior to Visit  Medication   albuterol (PROVENTIL) (2.5 MG/3ML) 0.083% nebulizer solution 2.5 mg    Allergies:  No Known Allergies  Social History:  Social History   Socioeconomic History  Marital status: Single    Spouse name: Not on file   Number of children: Not on file   Years of education: Not on file   Highest education level: Associate degree: academic program  Occupational History   Not on file  Tobacco Use   Smoking status: Never   Smokeless tobacco: Never  Vaping Use   Vaping status: Every Day  Substance and Sexual Activity   Alcohol use: No    Alcohol/week: 0.0 standard drinks of alcohol   Drug use: No   Sexual activity: Yes    Birth control/protection: Pill  Other Topics Concern   Not on file  Social History Narrative   Not on file   Social Drivers of Health   Financial Resource Strain: Low Risk  (04/01/2023)   Overall Financial Resource Strain (CARDIA)    Difficulty of Paying Living Expenses: Not very hard  Food Insecurity: No Food Insecurity (04/01/2023)   Hunger Vital Sign    Worried About Running Out of Food in the Last Year: Never true    Ran Out of Food in the Last Year: Never true  Transportation Needs: No Transportation Needs (04/01/2023)   PRAPARE -  Administrator, Civil Service (Medical): No    Lack of Transportation (Non-Medical): No  Physical Activity: Sufficiently Active (04/01/2023)   Exercise Vital Sign    Days of Exercise per Week: 5 days    Minutes of Exercise per Session: 80 min  Stress: No Stress Concern Present (04/01/2023)   [employer] of Occupational Health - Occupational Stress Questionnaire    Feeling of Stress : Not at all  Social Connections: Socially Isolated (04/01/2023)   Social Connection and Isolation Panel [NHANES]    Frequency of Communication with Friends and Family: Three times a week    Frequency of Social Gatherings with Friends and Family: Once a week    Attends Religious Services: Never    Database administrator or Organizations: No    Attends Engineer, structural: Not on file    Marital Status: Never married  Catering manager Violence: Not on file   Social History   Tobacco Use  Smoking Status Never  Smokeless Tobacco Never   Social History   Substance and Sexual Activity  Alcohol Use No   Alcohol/week: 0.0 standard drinks of alcohol    Family History:  Family History  Problem Relation Age of Onset   Thyroid disease Mother    Asthma Sister    Asthma Brother     Past medical history, surgical history, medications, allergies, family history and social history reviewed with patient today and changes made to appropriate areas of the chart.   Review of Systems  Constitutional: Negative.   HENT: Negative.         Was sick at the beginning of November, but has has clogged ears since then, has been on abx and steroids  Eyes: Negative.   Respiratory:  Positive for shortness of breath. Negative for cough, hemoptysis, sputum production and wheezing.   Cardiovascular: Negative.   Gastrointestinal: Negative.   Genitourinary: Negative.   Musculoskeletal: Negative.   Skin: Negative.   Neurological: Negative.   Endo/Heme/Allergies: Negative.    Psychiatric/Behavioral: Negative.     All other ROS negative except what is listed above and in the HPI.      Objective:    BP 102/72   Pulse (!) 114   Ht 5\' 9"  (1.753 m)   Wt 159 lb 9.6 oz (  72.4 kg)   LMP 03/06/2023 (Approximate)   SpO2 99%   BMI 23.57 kg/m   Wt Readings from Last 3 Encounters:  04/04/23 159 lb 9.6 oz (72.4 kg)  03/29/22 166 lb 4.8 oz (75.4 kg)  01/18/22 164 lb 11.2 oz (74.7 kg)    Physical Exam Vitals and nursing note reviewed. Exam conducted with a chaperone present.  Constitutional:      General: She is not in acute distress.    Appearance: Normal appearance. She is not ill-appearing, toxic-appearing or diaphoretic.  HENT:     Head: Normocephalic and atraumatic.     Right Ear: Tympanic membrane, ear canal and external ear normal. There is no impacted cerumen.     Left Ear: Tympanic membrane, ear canal and external ear normal. There is no impacted cerumen.     Nose: Nose normal. No congestion or rhinorrhea.     Mouth/Throat:     Mouth: Mucous membranes are moist.     Pharynx: Oropharynx is clear. No oropharyngeal exudate or posterior oropharyngeal erythema.  Eyes:     General: No scleral icterus.       Right eye: No discharge.        Left eye: No discharge.     Extraocular Movements: Extraocular movements intact.     Conjunctiva/sclera: Conjunctivae normal.     Pupils: Pupils are equal, round, and reactive to light.  Neck:     Vascular: No carotid bruit.  Cardiovascular:     Rate and Rhythm: Normal rate and regular rhythm.     Pulses: Normal pulses.     Heart sounds: No murmur heard.    No friction rub. No gallop.  Pulmonary:     Effort: Pulmonary effort is normal. No respiratory distress.     Breath sounds: Normal breath sounds. No stridor. No wheezing, rhonchi or rales.  Chest:     Chest wall: No tenderness.  Breasts:    Right: Normal.     Left: Normal.  Abdominal:     General: Abdomen is flat. Bowel sounds are normal. There is no  distension.     Palpations: Abdomen is soft. There is no mass.     Tenderness: There is no abdominal tenderness. There is no right CVA tenderness, left CVA tenderness, guarding or rebound.     Hernia: No hernia is present.  Genitourinary:    Vagina: No signs of injury and foreign body. Vaginal discharge present. No erythema, tenderness, bleeding, lesions or prolapsed vaginal walls.     Cervix: Discharge and friability present. No cervical motion tenderness, lesion, erythema, cervical bleeding or eversion.     Uterus: Normal.      Adnexa: Right adnexa normal and left adnexa normal.     Comments: Breast and pelvic exams deferred with shared decision making Musculoskeletal:        General: No swelling, tenderness, deformity or signs of injury.     Cervical back: Normal range of motion and neck supple. No rigidity. No muscular tenderness.     Right lower leg: No edema.     Left lower leg: No edema.  Lymphadenopathy:     Cervical: No cervical adenopathy.  Skin:    General: Skin is warm and dry.     Capillary Refill: Capillary refill takes less than 2 seconds.     Coloration: Skin is not jaundiced or pale.     Findings: No bruising, erythema, lesion or rash.  Neurological:     General: No focal deficit present.  Mental Status: She is alert and oriented to person, place, and time. Mental status is at baseline.     Cranial Nerves: No cranial nerve deficit.     Sensory: No sensory deficit.     Motor: No weakness.     Coordination: Coordination normal.     Gait: Gait normal.     Deep Tendon Reflexes: Reflexes normal.  Psychiatric:        Mood and Affect: Mood normal.        Behavior: Behavior normal.        Thought Content: Thought content normal.        Judgment: Judgment normal.     Results for orders placed or performed in visit on 03/29/22  Hepatitis B surface antibody,quantitative   Collection Time: 03/29/22  3:39 PM  Result Value Ref Range   Hepatitis B Surf Ab Quant 75.6  Immunity>9.9 mIU/mL  Measles/Mumps/Rubella Immunity   Collection Time: 03/29/22  3:39 PM  Result Value Ref Range   Rubella Antibodies, IGG 4.33 Immune >0.99 index   RUBEOLA AB, IGG 37.4 Immune >16.4 AU/mL   MUMPS ABS, IGG 185.0 Immune >10.9 AU/mL  CBC with Differential/Platelet   Collection Time: 03/29/22  3:39 PM  Result Value Ref Range   WBC 6.4 3.4 - 10.8 x10E3/uL   RBC 4.67 3.77 - 5.28 x10E6/uL   Hemoglobin 14.1 11.1 - 15.9 g/dL   Hematocrit 29.5 62.1 - 46.6 %   MCV 89 79 - 97 fL   MCH 30.2 26.6 - 33.0 pg   MCHC 34.0 31.5 - 35.7 g/dL   RDW 30.8 65.7 - 84.6 %   Platelets 332 150 - 450 x10E3/uL   Neutrophils 53 Not Estab. %   Lymphs 33 Not Estab. %   Monocytes 6 Not Estab. %   Eos 7 Not Estab. %   Basos 1 Not Estab. %   Neutrophils Absolute 3.3 1.4 - 7.0 x10E3/uL   Lymphocytes Absolute 2.1 0.7 - 3.1 x10E3/uL   Monocytes Absolute 0.4 0.1 - 0.9 x10E3/uL   EOS (ABSOLUTE) 0.5 (H) 0.0 - 0.4 x10E3/uL   Basophils Absolute 0.1 0.0 - 0.2 x10E3/uL   Immature Granulocytes 0 Not Estab. %   Immature Grans (Abs) 0.0 0.0 - 0.1 x10E3/uL  Comprehensive metabolic panel   Collection Time: 03/29/22  3:39 PM  Result Value Ref Range   Glucose 74 70 - 99 mg/dL   BUN 10 6 - 20 mg/dL   Creatinine, Ser 9.62 0.57 - 1.00 mg/dL   eGFR 952 >84 XL/KGM/0.10   BUN/Creatinine Ratio 12 9 - 23   Sodium 140 134 - 144 mmol/L   Potassium 3.9 3.5 - 5.2 mmol/L   Chloride 103 96 - 106 mmol/L   CO2 22 20 - 29 mmol/L   Calcium 9.6 8.7 - 10.2 mg/dL   Total Protein 7.1 6.0 - 8.5 g/dL   Albumin 4.8 4.0 - 5.0 g/dL   Globulin, Total 2.3 1.5 - 4.5 g/dL   Albumin/Globulin Ratio 2.1 1.2 - 2.2   Bilirubin Total 0.2 0.0 - 1.2 mg/dL   Alkaline Phosphatase 67 42 - 106 IU/L   AST 25 0 - 40 IU/L   ALT 30 0 - 32 IU/L  Lipid Panel w/o Chol/HDL Ratio   Collection Time: 03/29/22  3:39 PM  Result Value Ref Range   Cholesterol, Total 173 100 - 199 mg/dL   Triglycerides 272 0 - 149 mg/dL   HDL 52 >53 mg/dL   VLDL  Cholesterol Cal 23 5 -  40 mg/dL   LDL Chol Calc (NIH) 98 0 - 99 mg/dL  TSH   Collection Time: 03/29/22  3:39 PM  Result Value Ref Range   TSH 1.020 0.450 - 4.500 uIU/mL  Microscopic Examination   Collection Time: 03/29/22  3:41 PM  Result Value Ref Range   WBC, UA 0-5 0 - 5 /hpf   RBC, Urine None seen 0 - 2 /hpf   Epithelial Cells (non renal) 0-10 0 - 10 /hpf   Casts None seen None seen /lpf   Bacteria, UA Many (A) None seen/Few  Urinalysis, Routine w reflex microscopic   Collection Time: 03/29/22  3:41 PM  Result Value Ref Range   Specific Gravity, UA 1.019 1.005 - 1.030   pH, UA 5.5 5.0 - 7.5   Color, UA Yellow Yellow   Appearance Ur Cloudy (A) Clear   Leukocytes,UA 1+ (A) Negative   Protein,UA Negative Negative/Trace   Glucose, UA Negative Negative   Ketones, UA Negative Negative   RBC, UA Negative Negative   Bilirubin, UA Negative Negative   Urobilinogen, Ur 0.2 0.2 - 1.0 mg/dL   Nitrite, UA Negative Negative   Microscopic Examination See below:   Varicella zoster antibody, IgG   Collection Time: 03/30/22  9:24 AM  Result Value Ref Range   Varicella zoster IgG 199 Immune >165 index  GC/Chlamydia Probe Amp   Collection Time: 03/30/22  3:41 PM   Specimen: Urine   UR  Result Value Ref Range   Chlamydia trachomatis, NAA Negative Negative   Neisseria Gonorrhoeae by PCR Negative Negative      Assessment & Plan:   Problem List Items Addressed This Visit       Other   Anxiety   Doing well off medicine. Continue to monitor. Call with any concerns.       Other Visit Diagnoses       Routine general medical examination at a health care facility    -  Primary   Vaccines up to date/declined. Screening labs checked today. Pap done. Continue diet and exercise. Call with any concerns.   Relevant Orders   CBC with Differential/Platelet   Comprehensive metabolic panel   Lipid Panel w/o Chol/HDL Ratio   TSH   GC/Chlamydia Probe Amp   Cytology - PAP     Need for  diphtheria-tetanus-pertussis (Tdap) vaccine       Tdap due and given today.   Relevant Orders   Tdap vaccine greater than or equal to 7yo IM        Follow up plan: Return in about 1 year (around 04/03/2024) for physical.   LABORATORY TESTING:  - Pap smear: up to date  IMMUNIZATIONS:   - Tdap: Tetanus vaccination status reviewed: Tdap vaccination indicated and given today. - Influenza: Refused - Pneumovax: Not applicable - Prevnar: Not applicable - COVID: Refused - HPV: Refused  PATIENT COUNSELING:   Advised to take 1 mg of folate supplement per day if capable of pregnancy.   Sexuality: Discussed sexually transmitted diseases, partner selection, use of condoms, avoidance of unintended pregnancy  and contraceptive alternatives.   Advised to avoid cigarette smoking.  I discussed with the patient that most people either abstain from alcohol or drink within safe limits (<=14/week and <=4 drinks/occasion for males, <=7/weeks and <= 3 drinks/occasion for females) and that the risk for alcohol disorders and other health effects rises proportionally with the number of drinks per week and how often a drinker exceeds daily limits.  Discussed cessation/primary  prevention of drug use and availability of treatment for abuse.   Diet: Encouraged to adjust caloric intake to maintain  or achieve ideal body weight, to reduce intake of dietary saturated fat and total fat, to limit sodium intake by avoiding high sodium foods and not adding table salt, and to maintain adequate dietary potassium and calcium preferably from fresh fruits, vegetables, and low-fat dairy products.    stressed the importance of regular exercise  Injury prevention: Discussed safety belts, safety helmets, smoke detector, smoking near bedding or upholstery.   Dental health: Discussed importance of regular tooth brushing, flossing, and dental visits.    NEXT PREVENTATIVE PHYSICAL DUE IN 1 YEAR. Return in about 1 year  (around 04/03/2024) for physical.

## 2023-04-05 LAB — COMPREHENSIVE METABOLIC PANEL
ALT: 11 [IU]/L (ref 0–32)
AST: 10 [IU]/L (ref 0–40)
Albumin: 4.4 g/dL (ref 4.0–5.0)
Alkaline Phosphatase: 63 [IU]/L (ref 44–121)
BUN/Creatinine Ratio: 8 — ABNORMAL LOW (ref 9–23)
BUN: 7 mg/dL (ref 6–20)
Bilirubin Total: <0.2 mg/dL (ref 0.0–1.2)
CO2: 22 mmol/L (ref 20–29)
Calcium: 9.4 mg/dL (ref 8.7–10.2)
Chloride: 104 mmol/L (ref 96–106)
Creatinine, Ser: 0.89 mg/dL (ref 0.57–1.00)
Globulin, Total: 2.6 g/dL (ref 1.5–4.5)
Glucose: 81 mg/dL (ref 70–99)
Potassium: 3.9 mmol/L (ref 3.5–5.2)
Sodium: 141 mmol/L (ref 134–144)
Total Protein: 7 g/dL (ref 6.0–8.5)
eGFR: 95 mL/min/{1.73_m2} (ref >59)

## 2023-04-05 LAB — LIPID PANEL W/O CHOL/HDL RATIO
Cholesterol, Total: 154 mg/dL (ref 100–199)
HDL: 44 mg/dL (ref >39)
LDL Chol Calc (NIH): 86 mg/dL (ref 0–99)
Triglycerides: 137 mg/dL (ref 0–149)
VLDL Cholesterol Cal: 24 mg/dL (ref 5–40)

## 2023-04-05 LAB — CBC WITH DIFFERENTIAL/PLATELET
Basophils Absolute: 0.1 10*3/uL (ref 0.0–0.2)
Basos: 1 %
EOS (ABSOLUTE): 0.6 10*3/uL — ABNORMAL HIGH (ref 0.0–0.4)
Eos: 7 %
Hematocrit: 40.3 % (ref 34.0–46.6)
Hemoglobin: 13.1 g/dL (ref 11.1–15.9)
Immature Grans (Abs): 0 10*3/uL (ref 0.0–0.1)
Immature Granulocytes: 0 %
Lymphocytes Absolute: 2.9 10*3/uL (ref 0.7–3.1)
Lymphs: 36 %
MCH: 29.1 pg (ref 26.6–33.0)
MCHC: 32.5 g/dL (ref 31.5–35.7)
MCV: 90 fL (ref 79–97)
Monocytes Absolute: 0.5 10*3/uL (ref 0.1–0.9)
Monocytes: 6 %
Neutrophils Absolute: 4 10*3/uL (ref 1.4–7.0)
Neutrophils: 50 %
Platelets: 325 10*3/uL (ref 150–450)
RBC: 4.5 x10E6/uL (ref 3.77–5.28)
RDW: 12.7 % (ref 11.7–15.4)
WBC: 8 10*3/uL (ref 3.4–10.8)

## 2023-04-05 LAB — TSH: TSH: 1.24 u[IU]/mL (ref 0.450–4.500)

## 2023-04-06 LAB — GC/CHLAMYDIA PROBE AMP
Chlamydia trachomatis, NAA: NEGATIVE
Neisseria Gonorrhoeae by PCR: NEGATIVE

## 2023-04-06 LAB — CYTOLOGY - PAP
Adequacy: ABSENT
Diagnosis: NEGATIVE

## 2023-05-13 IMAGING — US US PELVIS COMPLETE TRANSABD/TRANSVAG W DUPLEX
1 series · 14 of 25 positions shown · non-contrast
Comparison: None.

CLINICAL DATA: Left-sided pelvic pain.



[Series 1: us pelvic complete w transvaginal and torsion righ · 14 of 106 slices shown]
[im 1/106]
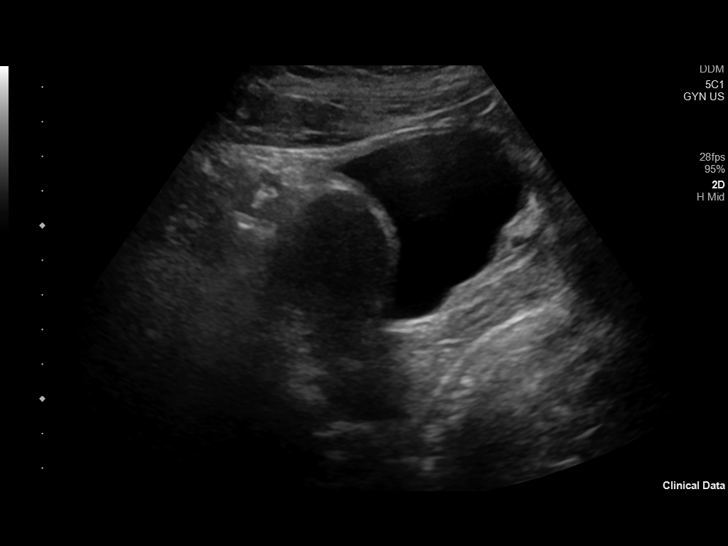
[im 9/106]
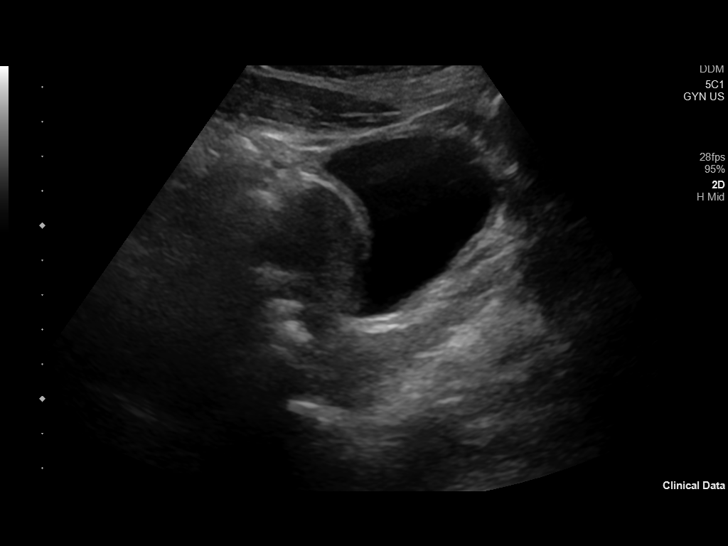
[im 18/106]
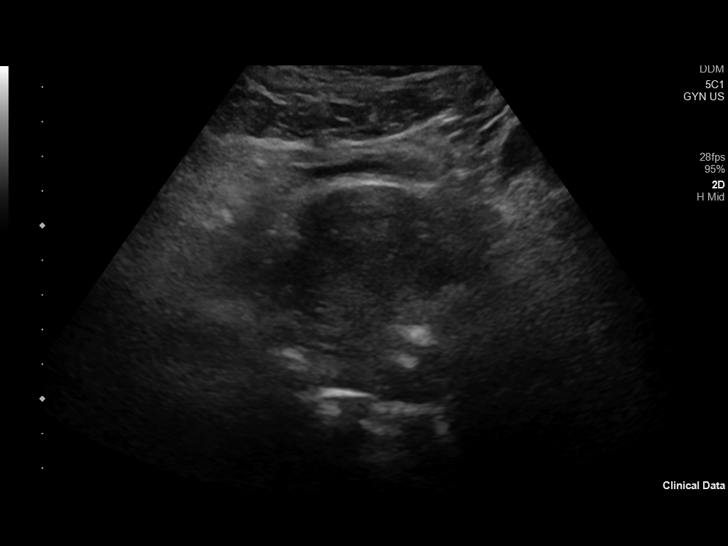
[im 27/106]
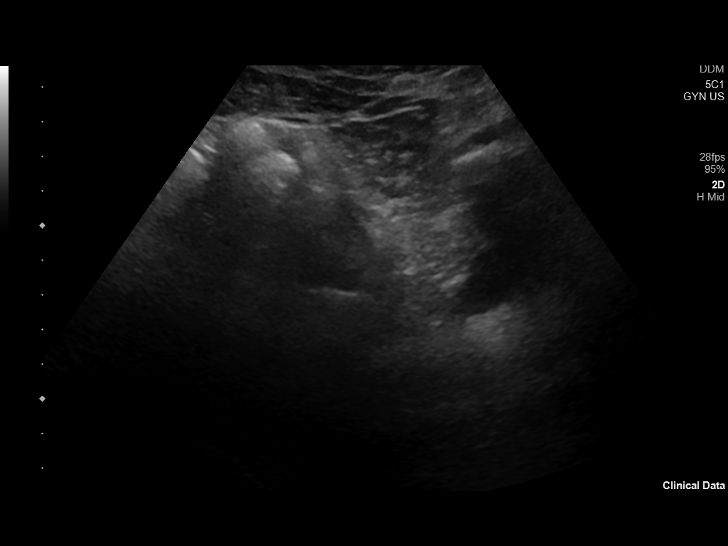
[im 36/106]
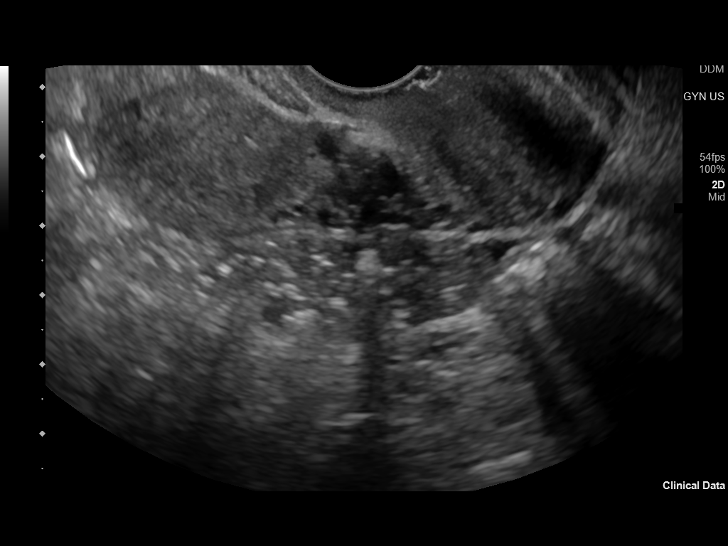
[im 40/106]
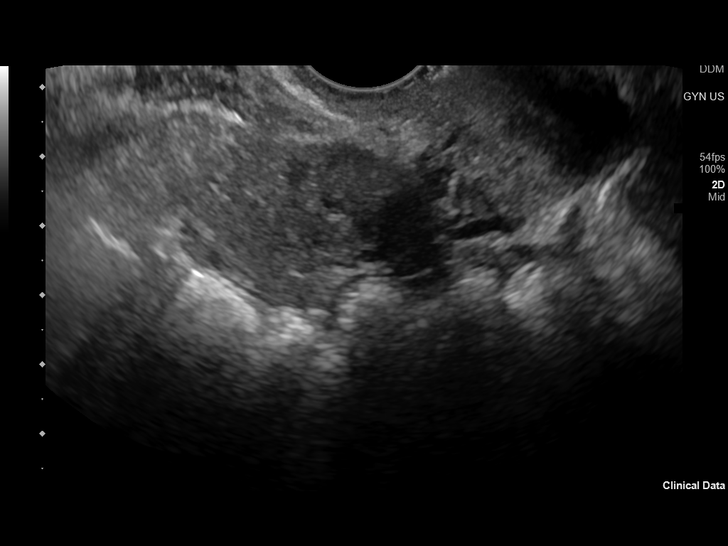
[im 49/106]
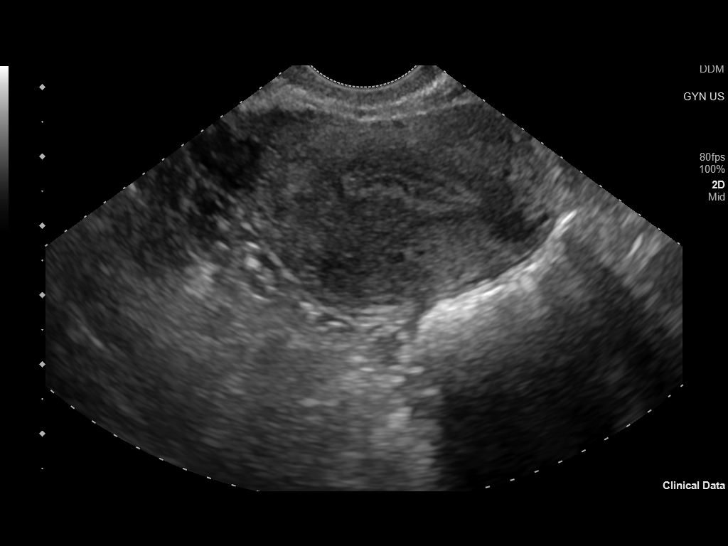
[im 57/106]
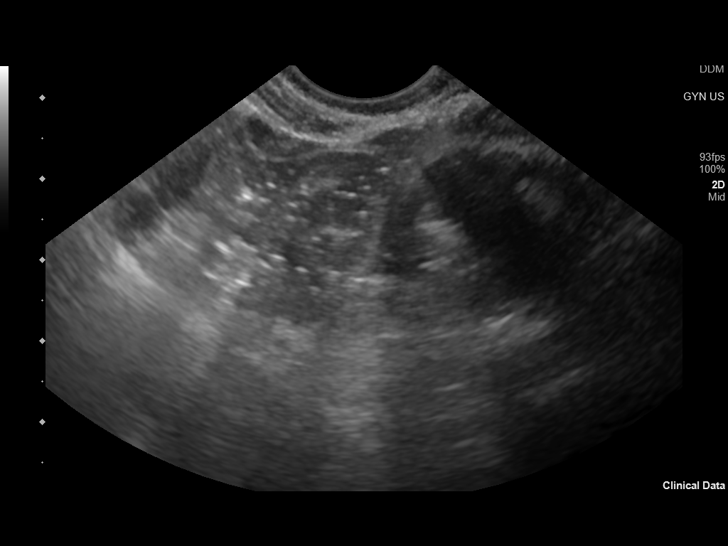
[im 66/106]
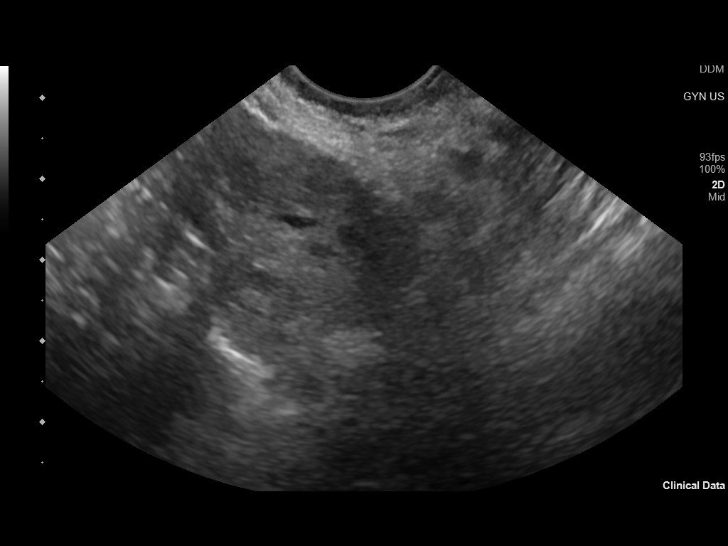
[im 71/106]
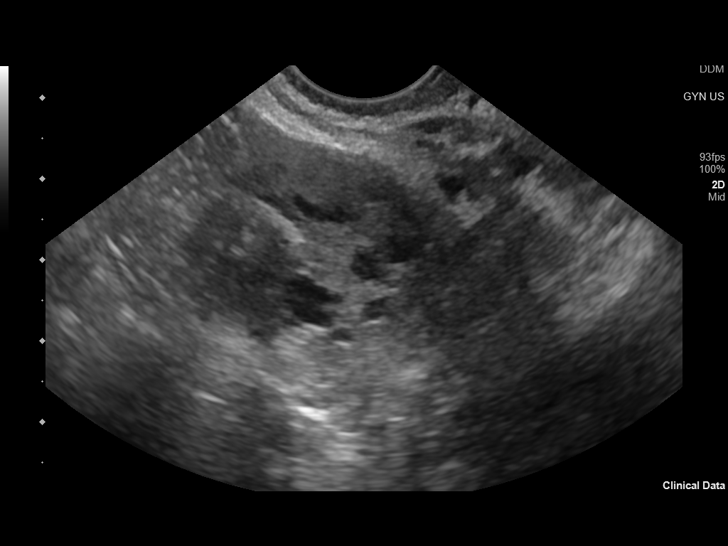
[im 79/106]
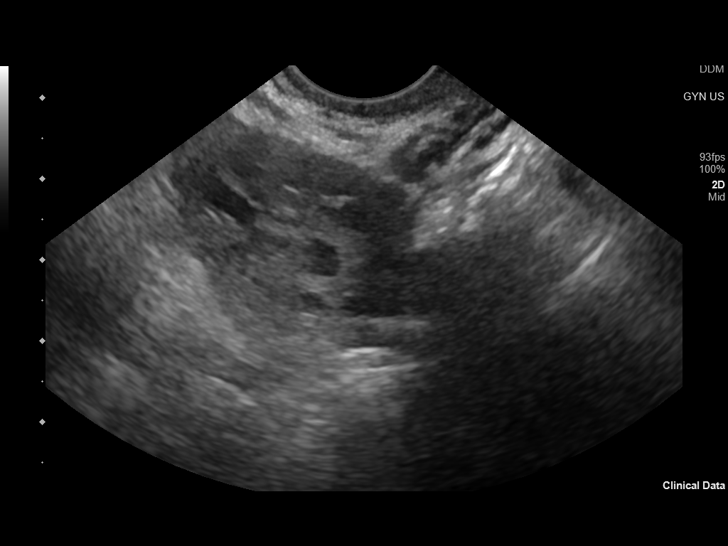
[im 88/106]
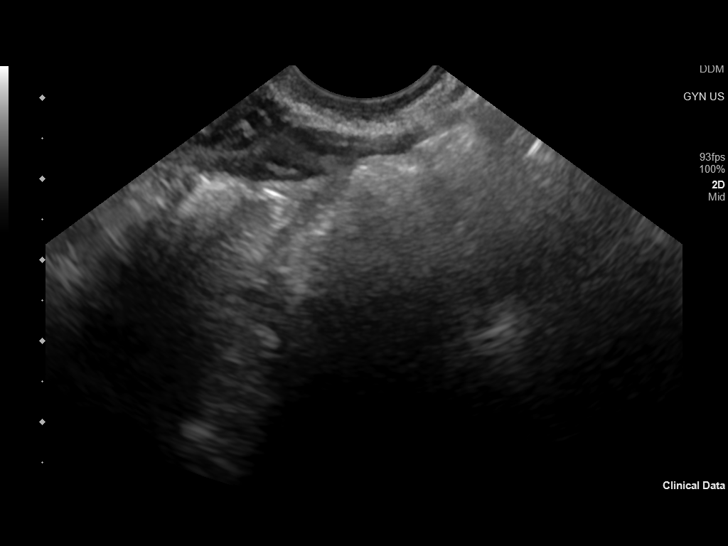
[im 97/106]
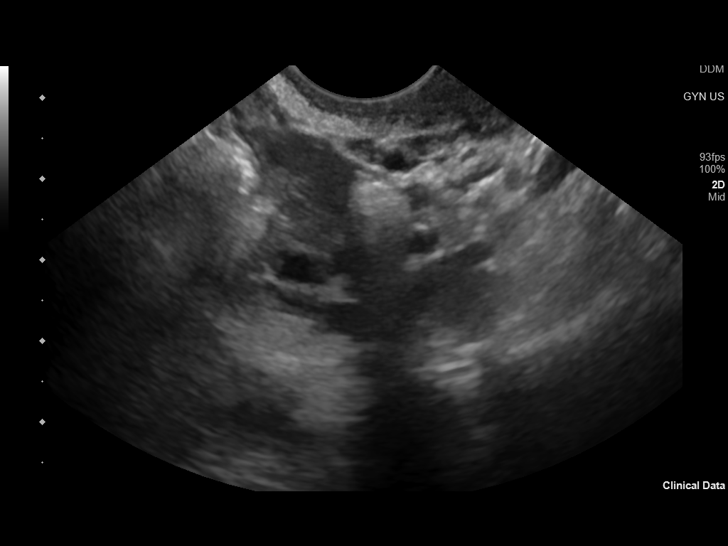
[im 106/106]
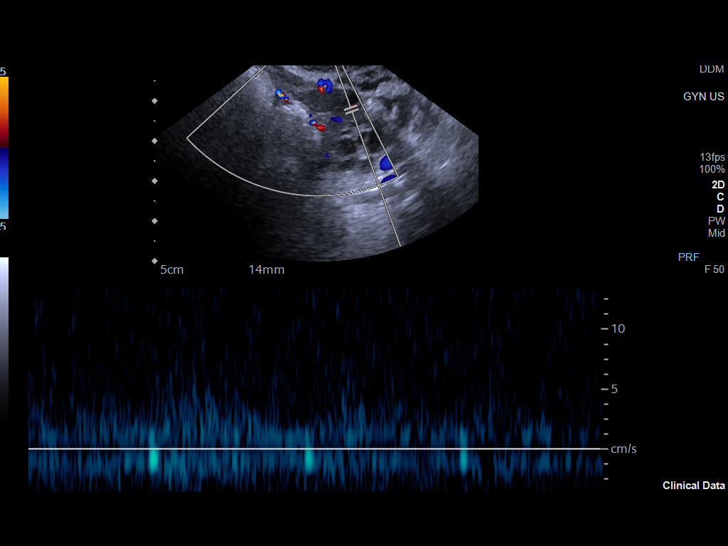

[14 of 25 positions shown; findings below may reference images not displayed]

FINDINGS: Uterus

Measurements: 7.6 x 3.2 x 4.4 cm = volume: 56 mL. No myometrial
abnormalities are identified.

Endometrium

Thickness: 7 mm.  Tiny amount endometrial fluid.

Right ovary

Measurements: 3.2 x 2.7 x 2.9 cm = volume: 5.6 mL. No cysts or
masses. Patent arterial and venous blood flow.

Left ovary

Measurements: 2.5 x 1.5 x 1.3 cm = volume: 2.6 mL. No cysts or
masses. Patent arterial and venous blood flow.

Other findings

No abnormal free fluid.
IMPRESSION: Unremarkable pelvic ultrasound examination.

## 2023-09-07 ENCOUNTER — Other Ambulatory Visit: Payer: Self-pay | Admitting: Family Medicine

## 2023-09-08 NOTE — Telephone Encounter (Signed)
 Requested Prescriptions  Pending Prescriptions Disp Refills   levalbuterol  (XOPENEX  HFA) 45 MCG/ACT inhaler [Pharmacy Med Name: LEVALBUTEROL  TAR HFA INH] 15 each 0    Sig: INHALE 1 TO 2 PUFFS BY MOUTH EVERY 6 HOURS AS NEEDED FOR WHEEZE     Pulmonology:  Beta Agonists 2 Failed - 09/08/2023  9:57 AM      Failed - Last Heart Rate in normal range    Pulse Readings from Last 1 Encounters:  04/04/23 (!) 114         Failed - Valid encounter within last 12 months    Recent Outpatient Visits   None     Future Appointments             In 6 months Johnson, Megan P, DO [place] [facility]            Passed - Last BP in normal range    BP Readings from Last 1 Encounters:  04/04/23 102/72

## 2023-10-05 ENCOUNTER — Other Ambulatory Visit: Payer: Self-pay | Admitting: Family Medicine

## 2023-10-06 ENCOUNTER — Other Ambulatory Visit: Payer: Self-pay | Admitting: Family Medicine

## 2023-10-06 NOTE — Telephone Encounter (Unsigned)
 Copied from CRM [id number]. Topic: Clinical - Medication Refill >> Oct 06, 2023  2:21 PM DeAngela L wrote: Medication: levalbuterol  (XOPENEX  HFA) 45 MCG/ACT inhaler   Has the patient contacted their pharmacy? Yes  (Agent: If no, request that the patient contact the pharmacy for the refill. If patient does not wish to contact the pharmacy document the reason why and proceed with request.) (Agent: If yes, when and what did the pharmacy advise?)  This is the patient's preferred pharmacy:  [id number] [place], [place] - [address] AT [facility] [address] [place] [address] Phone: [phone] Fax: [phone]  Is this the correct pharmacy for this prescription? Yes  If no, delete pharmacy and type the correct one.   Has the prescription been filled recently? Yes   Is the patient out of the medication? Yes   Has the patient been seen for an appointment in the last year OR does the patient have an upcoming appointment? Yes   Can we respond through MyChart? No   Agent: Please be advised that Rx refills may take up to 3 business days. We ask that you follow-up with your pharmacy.

## 2023-10-06 NOTE — Telephone Encounter (Signed)
 Requested Prescriptions  Pending Prescriptions Disp Refills   levalbuterol  (XOPENEX  HFA) 45 MCG/ACT inhaler [Pharmacy Med Name: LEVALBUTEROL  TAR HFA INH] 15 each 1    Sig: INHALE 1 TO 2 PUFFS BY MOUTH EVERY 6 HOURS AS NEEDED FOR WHEEZE     Pulmonology:  Beta Agonists 2 Failed - 10/06/2023  3:48 PM      Failed - Last Heart Rate in normal range    Pulse Readings from Last 1 Encounters:  04/04/23 (!) 114         Failed - Valid encounter within last 12 months    Recent Outpatient Visits   None     Future Appointments             In 6 months Johnson, Megan P, DO [place] [facility]            Passed - Last BP in normal range    BP Readings from Last 1 Encounters:  04/04/23 102/72

## 2023-10-10 NOTE — Telephone Encounter (Signed)
 Refused Xopenex  inhaler because this is a duplicate request.

## 2023-11-21 ENCOUNTER — Other Ambulatory Visit: Payer: Self-pay | Admitting: Family Medicine

## 2023-11-22 NOTE — Telephone Encounter (Signed)
 Requested Prescriptions  Pending Prescriptions Disp Refills   levalbuterol  (XOPENEX  HFA) 45 MCG/ACT inhaler [Pharmacy Med Name: LEVALBUTEROL  TAR HFA INH] 15 each 1    Sig: INHALE 1 TO 2 PUFFS BY MOUTH EVERY 6 HOURS AS NEEDED FOR WHEEZE     Pulmonology:  Beta Agonists 2 Failed - 11/22/2023  2:26 PM      Failed - Last Heart Rate in normal range    Pulse Readings from Last 1 Encounters:  04/04/23 (!) 114         Failed - Valid encounter within last 12 months    Recent Outpatient Visits   None     Future Appointments             In 4 months Johnson, Megan P, DO [place] [facility]            Passed - Last BP in normal range    BP Readings from Last 1 Encounters:  04/04/23 102/72

## 2024-01-12 ENCOUNTER — Other Ambulatory Visit: Payer: Self-pay | Admitting: Family Medicine

## 2024-01-12 NOTE — Telephone Encounter (Signed)
 Too soon for refill.  Requested Prescriptions  Pending Prescriptions Disp Refills   levalbuterol  (XOPENEX  HFA) 45 MCG/ACT inhaler [Pharmacy Med Name: LEVALBUTEROL  TAR HFA INH] 15 each 1    Sig: INHALE 1 TO 2 PUFFS BY MOUTH EVERY 6 HOURS AS NEEDED FOR WHEEZE     Pulmonology:  Beta Agonists 2 Failed - 01/12/2024  4:07 PM      Failed - Last Heart Rate in normal range    Pulse Readings from Last 1 Encounters:  04/04/23 (!) 114         Failed - Valid encounter within last 12 months    Recent Outpatient Visits   None     Future Appointments             In 2 months Johnson, Megan P, DO [place] [facility] [place], [address]            Passed - Last BP in normal range    BP Readings from Last 1 Encounters:  04/04/23 102/72

## 2024-01-16 ENCOUNTER — Other Ambulatory Visit: Payer: Self-pay

## 2024-01-16 MED ORDER — LEVALBUTEROL TARTRATE 45 MCG/ACT IN AERO: 1.0000 | INHALATION_SPRAY | Freq: Four times a day (QID) | RESPIRATORY_TRACT | 1 refills | Status: DC | PRN

## 2024-01-16 NOTE — Telephone Encounter (Signed)
 Copied from CRM #[id number]. Topic: Clinical - Medication Question >> Jan 16, 2024 11:39 AM Terri G wrote: Reason for CRM: Patient stated her levalbuterol  (XOPENEX  HFA) 45 MCG/ACT inhaler was rejected and she wants to know why. She does a physical in January. Callback number  [phone]

## 2024-01-16 NOTE — Addendum Note (Signed)
 Addended by: Sabino Denning M on: 01/16/2024 03:32 PM   Modules accepted: Orders

## 2024-01-22 ENCOUNTER — Telehealth: Payer: Self-pay | Admitting: Family Medicine

## 2024-01-22 ENCOUNTER — Other Ambulatory Visit: Payer: Self-pay

## 2024-01-22 MED ORDER — LEVALBUTEROL TARTRATE 45 MCG/ACT IN AERO: 1.0000 | INHALATION_SPRAY | Freq: Four times a day (QID) | RESPIRATORY_TRACT | 1 refills | Status: DC | PRN

## 2024-01-22 NOTE — Telephone Encounter (Signed)
 Sent to 

## 2024-01-22 NOTE — Telephone Encounter (Unsigned)
 Copied from CRM [id number]. Topic: Clinical - Prescription Issue >> Jan 22, 2024  3:12 PM Tiffini S wrote: Reason for CRM: Patient [phone] called about medication being denied for levalbuterol  (XOPENEX  HFA) 45 MCG/ACT inhaler and she is asking for a explanation why the medicine was rejected.   Advised patient no appointments scheduled with pcp- needs for medication refill  Patient has 100 units left in inhaler- please refill until appointment at   [id number] [place], [place] - [address] AT [facility] [address] [place] [address]

## 2024-02-06 ENCOUNTER — Encounter: Payer: Self-pay | Admitting: Family Medicine

## 2024-02-06 ENCOUNTER — Ambulatory Visit (INDEPENDENT_AMBULATORY_CARE_PROVIDER_SITE_OTHER): Admitting: Family Medicine

## 2024-02-06 VITALS — BP 106/70 | HR 87 | Temp 98.0°F | Ht 69.0 in | Wt 153.4 lb

## 2024-02-06 DIAGNOSIS — F419 Anxiety disorder, unspecified: Secondary | ICD-10-CM

## 2024-02-06 MED ORDER — LEVALBUTEROL TARTRATE 45 MCG/ACT IN AERO: 1.0000 | INHALATION_SPRAY | Freq: Four times a day (QID) | RESPIRATORY_TRACT | 1 refills | Status: DC | PRN

## 2024-02-06 NOTE — Progress Notes (Signed)
 BP 106/70   Pulse 87   Temp 98 F (36.7 C) (Oral)   Ht 5' 9 (1.753 m)   Wt 153 lb 6.4 oz (69.6 kg)   SpO2 100%   BMI 22.65 kg/m    Subjective:    Patient ID: Nicole Riddle, female    DOB: 2001-03-27, 23 y.o.   MRN: [id number]  HPI: Nicole Riddle is a 23 y.o. female  Chief Complaint  Patient presents with   Anxiety   Has stopped her OCP and anxiety medicine. Doing well. No concerns.   Relevant past medical, surgical, family and social history reviewed and updated as indicated. Interim medical history since our last visit reviewed. Allergies and medications reviewed and updated.  Review of Systems  Constitutional: Negative.   Respiratory: Negative.    Cardiovascular: Negative.   Musculoskeletal: Negative.   Psychiatric/Behavioral: Negative.      Per HPI unless specifically indicated above     Objective:    BP 106/70   Pulse 87   Temp 98 F (36.7 C) (Oral)   Ht 5' 9 (1.753 m)   Wt 153 lb 6.4 oz (69.6 kg)   SpO2 100%   BMI 22.65 kg/m   Wt Readings from Last 3 Encounters:  02/06/24 153 lb 6.4 oz (69.6 kg)  04/04/23 159 lb 9.6 oz (72.4 kg)  03/29/22 166 lb 4.8 oz (75.4 kg)    Physical Exam Vitals and nursing note reviewed.  Constitutional:      General: She is not in acute distress.    Appearance: Normal appearance. She is not ill-appearing, toxic-appearing or diaphoretic.  HENT:     Head: Normocephalic and atraumatic.     Right Ear: External ear normal.     Left Ear: External ear normal.     Nose: Nose normal.     Mouth/Throat:     Mouth: Mucous membranes are moist.     Pharynx: Oropharynx is clear.  Eyes:     General: No scleral icterus.       Right eye: No discharge.        Left eye: No discharge.     Extraocular Movements: Extraocular movements intact.     Conjunctiva/sclera: Conjunctivae normal.     Pupils: Pupils are equal, round, and reactive to light.  Cardiovascular:     Rate and Rhythm: Normal rate and regular rhythm.     Pulses: Normal  pulses.     Heart sounds: Normal heart sounds. No murmur heard.    No friction rub. No gallop.  Pulmonary:     Effort: Pulmonary effort is normal. No respiratory distress.     Breath sounds: Normal breath sounds. No stridor. No wheezing, rhonchi or rales.  Chest:     Chest wall: No tenderness.  Musculoskeletal:        General: Normal range of motion.     Cervical back: Normal range of motion and neck supple.  Skin:    General: Skin is warm and dry.     Capillary Refill: Capillary refill takes less than 2 seconds.     Coloration: Skin is not jaundiced or pale.     Findings: No bruising, erythema, lesion or rash.  Neurological:     General: No focal deficit present.     Mental Status: She is alert and oriented to person, place, and time. Mental status is at baseline.  Psychiatric:        Mood and Affect: Mood normal.  Behavior: Behavior normal.        Thought Content: Thought content normal.        Judgment: Judgment normal.     Results for orders placed or performed in visit on 04/04/23  Cytology - PAP   Collection Time: 04/04/23  4:02 PM  Result Value Ref Range   Adequacy      Satisfactory for evaluation; transformation zone component ABSENT.   Diagnosis      - Negative for intraepithelial lesion or malignancy (NILM)  GC/Chlamydia Probe Amp   Collection Time: 04/04/23  4:03 PM   Specimen: Urine   UR  Result Value Ref Range   Chlamydia trachomatis, NAA Negative Negative   Neisseria Gonorrhoeae by PCR Negative Negative  CBC with Differential/Platelet   Collection Time: 04/04/23  4:04 PM  Result Value Ref Range   WBC 8.0 3.4 - 10.8 x10E3/uL   RBC 4.50 3.77 - 5.28 x10E6/uL   Hemoglobin 13.1 11.1 - 15.9 g/dL   Hematocrit 59.6 65.9 - 46.6 %   MCV 90 79 - 97 fL   MCH 29.1 26.6 - 33.0 pg   MCHC 32.5 31.5 - 35.7 g/dL   RDW 87.2 88.2 - 84.5 %   Platelets 325 150 - 450 x10E3/uL   Neutrophils 50 Not Estab. %   Lymphs 36 Not Estab. %   Monocytes 6 Not Estab. %   Eos  7 Not Estab. %   Basos 1 Not Estab. %   Neutrophils Absolute 4.0 1.4 - 7.0 x10E3/uL   Lymphocytes Absolute 2.9 0.7 - 3.1 x10E3/uL   Monocytes Absolute 0.5 0.1 - 0.9 x10E3/uL   EOS (ABSOLUTE) 0.6 (H) 0.0 - 0.4 x10E3/uL   Basophils Absolute 0.1 0.0 - 0.2 x10E3/uL   Immature Granulocytes 0 Not Estab. %   Immature Grans (Abs) 0.0 0.0 - 0.1 x10E3/uL  Comprehensive metabolic panel   Collection Time: 04/04/23  4:04 PM  Result Value Ref Range   Glucose 81 70 - 99 mg/dL   BUN 7 6 - 20 mg/dL   Creatinine, Ser 9.10 0.57 - 1.00 mg/dL   eGFR 95 >40 fO/fpw/8.26   BUN/Creatinine Ratio 8 (L) 9 - 23   Sodium 141 134 - 144 mmol/L   Potassium 3.9 3.5 - 5.2 mmol/L   Chloride 104 96 - 106 mmol/L   CO2 22 20 - 29 mmol/L   Calcium 9.4 8.7 - 10.2 mg/dL   Total Protein 7.0 6.0 - 8.5 g/dL   Albumin 4.4 4.0 - 5.0 g/dL   Globulin, Total 2.6 1.5 - 4.5 g/dL   Bilirubin Total <9.7 0.0 - 1.2 mg/dL   Alkaline Phosphatase 63 44 - 121 IU/L   AST 10 0 - 40 IU/L   ALT 11 0 - 32 IU/L  Lipid Panel w/o Chol/HDL Ratio   Collection Time: 04/04/23  4:04 PM  Result Value Ref Range   Cholesterol, Total 154 100 - 199 mg/dL   Triglycerides 862 0 - 149 mg/dL   HDL 44 >60 mg/dL   VLDL Cholesterol Cal 24 5 - 40 mg/dL   LDL Chol Calc (NIH) 86 0 - 99 mg/dL  TSH   Collection Time: 04/04/23  4:04 PM  Result Value Ref Range   TSH 1.240 0.450 - 4.500 uIU/mL      Assessment & Plan:   Problem List Items Addressed This Visit       Other   Anxiety - Primary   Doing well off meds. Continue to monitor. Call with any concerns.  Follow up plan: Return in about 2 months (around 04/07/2024) for physical.

## 2024-02-06 NOTE — Assessment & Plan Note (Signed)
Doing well off meds. Continue to monitor. Call with any concerns.

## 2024-03-29 ENCOUNTER — Other Ambulatory Visit: Payer: Self-pay | Admitting: Family Medicine

## 2024-04-01 NOTE — Telephone Encounter (Signed)
 Requested Prescriptions  Pending Prescriptions Disp Refills   levalbuterol  (XOPENEX  HFA) 45 MCG/ACT inhaler [Pharmacy Med Name: LEVALBUTEROL  TAR HFA INH] 15 each 2    Sig: INHALE 1 TO 2 PUFFS BY MOUTH EVERY 6 HOURS AS NEEDED FOR WHEEZE     Pulmonology:  Beta Agonists 2 Passed - 04/01/2024  2:01 PM      Passed - Last BP in normal range    BP Readings from Last 1 Encounters:  02/06/24 106/70         Passed - Last Heart Rate in normal range    Pulse Readings from Last 1 Encounters:  02/06/24 87         Passed - Valid encounter within last 12 months    Recent Outpatient Visits           1 month ago Anxiety   [facility] [place], [place], DO

## 2024-04-04 ENCOUNTER — Encounter: Payer: Self-pay | Admitting: Family Medicine

## 2024-04-26 ENCOUNTER — Encounter: Payer: Self-pay | Admitting: Family Medicine

## 2024-04-26 ENCOUNTER — Ambulatory Visit (INDEPENDENT_AMBULATORY_CARE_PROVIDER_SITE_OTHER): Admitting: Family Medicine

## 2024-04-26 VITALS — BP 111/73 | HR 98 | Temp 98.0°F | Ht 69.0 in | Wt 149.4 lb

## 2024-04-26 DIAGNOSIS — J45990 Exercise induced bronchospasm: Secondary | ICD-10-CM

## 2024-04-26 DIAGNOSIS — Z0000 Encounter for general adult medical examination without abnormal findings: Secondary | ICD-10-CM

## 2024-04-26 MED ORDER — BUDESONIDE-FORMOTEROL FUMARATE 160-4.5 MCG/ACT IN AERO: 2.0000 | INHALATION_SPRAY | Freq: Two times a day (BID) | RESPIRATORY_TRACT | 3 refills | Status: AC

## 2024-04-26 MED ORDER — LEVALBUTEROL TARTRATE 45 MCG/ACT IN AERO: 1.0000 | INHALATION_SPRAY | Freq: Three times a day (TID) | RESPIRATORY_TRACT | 2 refills | Status: AC | PRN

## 2024-04-26 NOTE — Progress Notes (Signed)
 "  BP 111/73   Pulse 98   Temp 98 F (36.7 C) (Oral)   Ht 5' 9 (1.753 m)   Wt 149 lb 6.4 oz (67.8 kg)   LMP 04/18/2024 (Approximate)   SpO2 98%   BMI 22.06 kg/m    Subjective:    Patient ID: Nicole Riddle, female    DOB: [birth year], 24 y.o.   MRN: [id number]  HPI: Nicole Riddle is a 24 y.o. female presenting on 04/26/2024 for comprehensive medical examination. Current medical complaints include:  ASTHMA Asthma status: controlled Satisfied with current treatment?: yes Albuterol /rescue inhaler frequency: rarely  Dyspnea frequency: rarely Wheezing frequency: rarely Cough frequency: rarely Nocturnal symptom frequency:  never Limitation of activity: no Current upper respiratory symptoms: no Aerochamber/spacer use: no Visits to ER or Urgent Care in past year: yes Pneumovax: Up to Date Influenza: Not up to Date   Menopausal Symptoms: no  Depression Screen done today and results listed below:     04/26/2024    1:26 PM 02/06/2024   11:20 AM 04/04/2023    3:48 PM 03/29/2022    2:52 PM 01/18/2022    2:46 PM  Depression screen PHQ 2/9  Decreased Interest 0 2 1 0 0  Down, Depressed, Hopeless 0 1 1 0 0  PHQ - 2 Score 0 3 2 0 0  Altered sleeping 0 0 0 3 1  Tired, decreased energy 1 0 0 3 1  Change in appetite 0 0 0 0 0  Feeling bad or failure about yourself  0 0 0 0 0  Trouble concentrating 0 0 0 0 0  Moving slowly or fidgety/restless 0 0 0 0 0  Suicidal thoughts 0 0 0 0 0  PHQ-9 Score 1 3  2  6  2    Difficult doing work/chores Not difficult at all Somewhat difficult Not difficult at all Somewhat difficult Not difficult at all     Data saved with a previous flowsheet row definition    Past Medical History:  Past Medical History:  Diagnosis Date   Anxiety    Asthma    Plantar wart    Psoriasis     Surgical History:  Past Surgical History:  Procedure Laterality Date   none      Medications:  No current outpatient medications on file prior to visit.   Current  Facility-Administered Medications on File Prior to Visit  Medication   albuterol  (PROVENTIL ) (2.5 MG/3ML) 0.083% nebulizer solution 2.5 mg    Allergies:  Allergies[1]  Social History:  Social History   Socioeconomic History   Marital status: Single    Spouse name: Not on file   Number of children: Not on file   Years of education: Not on file   Highest education level: Associate degree: academic program  Occupational History   Not on file  Tobacco Use   Smoking status: Never   Smokeless tobacco: Never  Vaping Use   Vaping status: Every Day  Substance and Sexual Activity   Alcohol use: Yes   Drug use: Never   Sexual activity: Not Currently    Birth control/protection: Abstinence, Condom  Other Topics Concern   Not on file  Social History Narrative   Not on file   Social Drivers of Health   Tobacco Use: Low Risk (04/26/2024)   Patient History    Smoking Tobacco Use: Never    Smokeless Tobacco Use: Never    Passive Exposure: Not on file  Financial Resource Strain: Low Risk (04/01/2023)  Overall Financial Resource Strain (CARDIA)    Difficulty of Paying Living Expenses: Not very hard  Food Insecurity: No Food Insecurity (04/01/2023)   Hunger Vital Sign    Worried About Running Out of Food in the Last Year: Never true    Ran Out of Food in the Last Year: Never true  Transportation Needs: No Transportation Needs (04/01/2023)   PRAPARE - Administrator, Civil Service (Medical): No    Lack of Transportation (Non-Medical): No  Physical Activity: Sufficiently Active (04/01/2023)   Exercise Vital Sign    Days of Exercise per Week: 5 days    Minutes of Exercise per Session: 80 min  Stress: No Stress Concern Present (04/01/2023)   [employer] of Occupational Health - Occupational Stress Questionnaire    Feeling of Stress : Not at all  Social Connections: Socially Isolated (04/01/2023)   Social Connection and Isolation Panel    Frequency of Communication  with Friends and Family: Three times a week    Frequency of Social Gatherings with Friends and Family: Once a week    Attends Religious Services: Never    Database Administrator or Organizations: No    Attends Engineer, Structural: Not on file    Marital Status: Never married  Intimate Partner Violence: Not on file  Depression (PHQ2-9): Low Risk (04/26/2024)   Depression (PHQ2-9)    PHQ-2 Score: 1  Alcohol Screen: Low Risk (04/01/2023)   Alcohol Screen    Last Alcohol Screening Score (AUDIT): 2  Housing: Low Risk (04/01/2023)   Housing Stability Vital Sign    Unable to Pay for Housing in the Last Year: No    Number of Times Moved in the Last Year: 0    Homeless in the Last Year: No  Utilities: Not on file  Health Literacy: Not on file   Tobacco Use History[2] Social History   Substance and Sexual Activity  Alcohol Use Yes    Family History:  Family History  Problem Relation Age of Onset   Thyroid  disease Mother    Asthma Sister    Asthma Brother     Past medical history, surgical history, medications, allergies, family history and social history reviewed with patient today and changes made to appropriate areas of the chart.   Review of Systems  Constitutional: Negative.   HENT: Negative.    Eyes: Negative.   Respiratory: Negative.    Cardiovascular: Negative.   Gastrointestinal:  Positive for diarrhea (with urgency first thing in the AM). Negative for abdominal pain, blood in stool, constipation, heartburn, melena, nausea and vomiting.  Genitourinary: Negative.   Musculoskeletal: Negative.   Skin: Negative.   Neurological: Negative.   Endo/Heme/Allergies: Negative.   Psychiatric/Behavioral: Negative.     All other ROS negative except what is listed above and in the HPI.      Objective:    BP 111/73   Pulse 98   Temp 98 F (36.7 C) (Oral)   Ht 5' 9 (1.753 m)   Wt 149 lb 6.4 oz (67.8 kg)   LMP 04/18/2024 (Approximate)   SpO2 98%   BMI 22.06 kg/m    Wt Readings from Last 3 Encounters:  04/26/24 149 lb 6.4 oz (67.8 kg)  02/06/24 153 lb 6.4 oz (69.6 kg)  04/04/23 159 lb 9.6 oz (72.4 kg)    Physical Exam Vitals and nursing note reviewed.  Constitutional:      General: She is not in acute distress.    Appearance:  Normal appearance. She is not ill-appearing, toxic-appearing or diaphoretic.  HENT:     Head: Normocephalic and atraumatic.     Right Ear: Tympanic membrane, ear canal and external ear normal. There is no impacted cerumen.     Left Ear: Tympanic membrane, ear canal and external ear normal. There is no impacted cerumen.     Nose: Nose normal. No congestion or rhinorrhea.     Mouth/Throat:     Mouth: Mucous membranes are moist.     Pharynx: Oropharynx is clear. No oropharyngeal exudate or posterior oropharyngeal erythema.  Eyes:     General: No scleral icterus.       Right eye: No discharge.        Left eye: No discharge.     Extraocular Movements: Extraocular movements intact.     Conjunctiva/sclera: Conjunctivae normal.     Pupils: Pupils are equal, round, and reactive to light.  Neck:     Vascular: No carotid bruit.  Cardiovascular:     Rate and Rhythm: Normal rate and regular rhythm.     Pulses: Normal pulses.     Heart sounds: No murmur heard.    No friction rub. No gallop.  Pulmonary:     Effort: Pulmonary effort is normal. No respiratory distress.     Breath sounds: Normal breath sounds. No stridor. No wheezing, rhonchi or rales.  Chest:     Chest wall: No tenderness.  Abdominal:     General: Abdomen is flat. Bowel sounds are normal. There is no distension.     Palpations: Abdomen is soft. There is no mass.     Tenderness: There is no abdominal tenderness. There is no right CVA tenderness, left CVA tenderness, guarding or rebound.     Hernia: No hernia is present.  Genitourinary:    Comments: Breast and pelvic exams deferred with shared decision making Musculoskeletal:        General: No swelling,  tenderness, deformity or signs of injury.     Cervical back: Normal range of motion and neck supple. No rigidity. No muscular tenderness.     Right lower leg: No edema.     Left lower leg: No edema.  Lymphadenopathy:     Cervical: No cervical adenopathy.  Skin:    General: Skin is warm and dry.     Capillary Refill: Capillary refill takes less than 2 seconds.     Coloration: Skin is not jaundiced or pale.     Findings: No bruising, erythema, lesion or rash.  Neurological:     General: No focal deficit present.     Mental Status: She is alert and oriented to person, place, and time. Mental status is at baseline.     Cranial Nerves: No cranial nerve deficit.     Sensory: No sensory deficit.     Motor: No weakness.     Coordination: Coordination normal.     Gait: Gait normal.     Deep Tendon Reflexes: Reflexes normal.  Psychiatric:        Mood and Affect: Mood normal.        Behavior: Behavior normal.        Thought Content: Thought content normal.        Judgment: Judgment normal.     Results for orders placed or performed in visit on 04/04/23  Cytology - PAP   Collection Time: 04/04/23  4:02 PM  Result Value Ref Range   Adequacy      Satisfactory for evaluation; transformation  zone component ABSENT.   Diagnosis      - Negative for intraepithelial lesion or malignancy (NILM)  GC/Chlamydia Probe Amp   Collection Time: 04/04/23  4:03 PM   Specimen: Urine   UR  Result Value Ref Range   Chlamydia trachomatis, NAA Negative Negative   Neisseria Gonorrhoeae by PCR Negative Negative  CBC with Differential/Platelet   Collection Time: 04/04/23  4:04 PM  Result Value Ref Range   WBC 8.0 3.4 - 10.8 x10E3/uL   RBC 4.50 3.77 - 5.28 x10E6/uL   Hemoglobin 13.1 11.1 - 15.9 g/dL   Hematocrit 59.6 65.9 - 46.6 %   MCV 90 79 - 97 fL   MCH 29.1 26.6 - 33.0 pg   MCHC 32.5 31.5 - 35.7 g/dL   RDW 87.2 88.2 - 84.5 %   Platelets 325 150 - 450 x10E3/uL   Neutrophils 50 Not Estab. %   Lymphs  36 Not Estab. %   Monocytes 6 Not Estab. %   Eos 7 Not Estab. %   Basos 1 Not Estab. %   Neutrophils Absolute 4.0 1.4 - 7.0 x10E3/uL   Lymphocytes Absolute 2.9 0.7 - 3.1 x10E3/uL   Monocytes Absolute 0.5 0.1 - 0.9 x10E3/uL   EOS (ABSOLUTE) 0.6 (H) 0.0 - 0.4 x10E3/uL   Basophils Absolute 0.1 0.0 - 0.2 x10E3/uL   Immature Granulocytes 0 Not Estab. %   Immature Grans (Abs) 0.0 0.0 - 0.1 x10E3/uL  Comprehensive metabolic panel   Collection Time: 04/04/23  4:04 PM  Result Value Ref Range   Glucose 81 70 - 99 mg/dL   BUN 7 6 - 20 mg/dL   Creatinine, Ser 9.10 0.57 - 1.00 mg/dL   eGFR 95 >40 fO/fpw/8.26   BUN/Creatinine Ratio 8 (L) 9 - 23   Sodium 141 134 - 144 mmol/L   Potassium 3.9 3.5 - 5.2 mmol/L   Chloride 104 96 - 106 mmol/L   CO2 22 20 - 29 mmol/L   Calcium 9.4 8.7 - 10.2 mg/dL   Total Protein 7.0 6.0 - 8.5 g/dL   Albumin 4.4 4.0 - 5.0 g/dL   Globulin, Total 2.6 1.5 - 4.5 g/dL   Bilirubin Total <9.7 0.0 - 1.2 mg/dL   Alkaline Phosphatase 63 44 - 121 IU/L   AST 10 0 - 40 IU/L   ALT 11 0 - 32 IU/L  Lipid Panel w/o Chol/HDL Ratio   Collection Time: 04/04/23  4:04 PM  Result Value Ref Range   Cholesterol, Total 154 100 - 199 mg/dL   Triglycerides 862 0 - 149 mg/dL   HDL 44 >60 mg/dL   VLDL Cholesterol Cal 24 5 - 40 mg/dL   LDL Chol Calc (NIH) 86 0 - 99 mg/dL  TSH   Collection Time: 04/04/23  4:04 PM  Result Value Ref Range   TSH 1.240 0.450 - 4.500 uIU/mL      Assessment & Plan:   Problem List Items Addressed This Visit       Respiratory   Exercise-induced asthma   Will start symbicort  when she gets sick. Continue to monitor. Call with any concerns.       Relevant Medications   budesonide -formoterol  (SYMBICORT ) 160-4.5 MCG/ACT inhaler   levalbuterol  (XOPENEX  HFA) 45 MCG/ACT inhaler   Other Visit Diagnoses       Routine general medical examination at a health care facility    -  Primary   Vaccines up to date/declined. Screening labs checked today. Pap up to  date. Continue to monitor.  Call with any concerns.   Relevant Orders   CBC with Differential/Platelet   Comprehensive metabolic panel with GFR   Lipid Panel w/o Chol/HDL Ratio   TSH   GC/Chlamydia Probe Amp        Follow up plan: Return in about 1 year (around 04/26/2025) for physical.   LABORATORY TESTING:  - Pap smear: up to date  IMMUNIZATIONS:   - Tdap: Tetanus vaccination status reviewed: last tetanus booster within 10 years. - Influenza: Refused - Prevnar: Up to date - COVID: Refused - HPV: Refused  PATIENT COUNSELING:   Advised to take 1 mg of folate supplement per day if capable of pregnancy.   Sexuality: Discussed sexually transmitted diseases, partner selection, use of condoms, avoidance of unintended pregnancy  and contraceptive alternatives.   Advised to avoid cigarette smoking.  I discussed with the patient that most people either abstain from alcohol or drink within safe limits (<=14/week and <=4 drinks/occasion for males, <=7/weeks and <= 3 drinks/occasion for females) and that the risk for alcohol disorders and other health effects rises proportionally with the number of drinks per week and how often a drinker exceeds daily limits.  Discussed cessation/primary prevention of drug use and availability of treatment for abuse.   Diet: Encouraged to adjust caloric intake to maintain  or achieve ideal body weight, to reduce intake of dietary saturated fat and total fat, to limit sodium intake by avoiding high sodium foods and not adding table salt, and to maintain adequate dietary potassium and calcium preferably from fresh fruits, vegetables, and low-fat dairy products.    stressed the importance of regular exercise  Injury prevention: Discussed safety belts, safety helmets, smoke detector, smoking near bedding or upholstery.   Dental health: Discussed importance of regular tooth brushing, flossing, and dental visits.    NEXT PREVENTATIVE PHYSICAL DUE IN 1  YEAR. Return in about 1 year (around 04/26/2025) for physical.              [1] No Known Allergies [2]  Social History Tobacco Use  Smoking Status Never  Smokeless Tobacco Never   "

## 2024-04-26 NOTE — Assessment & Plan Note (Signed)
 Will start symbicort  when she gets sick. Continue to monitor. Call with any concerns.

## 2024-04-27 LAB — COMPREHENSIVE METABOLIC PANEL WITH GFR
ALT: 13 IU/L (ref 0–32)
AST: 12 IU/L (ref 0–40)
Albumin: 4.5 g/dL (ref 4.0–5.0)
Alkaline Phosphatase: 57 IU/L (ref 41–116)
BUN/Creatinine Ratio: 15 (ref 9–23)
BUN: 11 mg/dL (ref 6–20)
Bilirubin Total: 0.3 mg/dL (ref 0.0–1.2)
CO2: 23 mmol/L (ref 20–29)
Calcium: 9.6 mg/dL (ref 8.7–10.2)
Chloride: 104 mmol/L (ref 96–106)
Creatinine, Ser: 0.74 mg/dL (ref 0.57–1.00)
Globulin, Total: 2.4 g/dL (ref 1.5–4.5)
Glucose: 73 mg/dL (ref 70–99)
Potassium: 4.4 mmol/L (ref 3.5–5.2)
Sodium: 138 mmol/L (ref 134–144)
Total Protein: 6.9 g/dL (ref 6.0–8.5)
eGFR: 117 mL/min/1.73

## 2024-04-27 LAB — CBC WITH DIFFERENTIAL/PLATELET
Basophils Absolute: 0.1 x10E3/uL (ref 0.0–0.2)
Basos: 1 %
EOS (ABSOLUTE): 0.4 x10E3/uL (ref 0.0–0.4)
Eos: 5 %
Hematocrit: 43.7 % (ref 34.0–46.6)
Hemoglobin: 14.1 g/dL (ref 11.1–15.9)
Immature Grans (Abs): 0 x10E3/uL (ref 0.0–0.1)
Immature Granulocytes: 0 %
Lymphocytes Absolute: 2.6 x10E3/uL (ref 0.7–3.1)
Lymphs: 34 %
MCH: 29.6 pg (ref 26.6–33.0)
MCHC: 32.3 g/dL (ref 31.5–35.7)
MCV: 92 fL (ref 79–97)
Monocytes Absolute: 0.6 x10E3/uL (ref 0.1–0.9)
Monocytes: 8 %
Neutrophils Absolute: 4 x10E3/uL (ref 1.4–7.0)
Neutrophils: 52 %
Platelets: 403 x10E3/uL (ref 150–450)
RBC: 4.76 x10E6/uL (ref 3.77–5.28)
RDW: 12.6 % (ref 11.7–15.4)
WBC: 7.7 x10E3/uL (ref 3.4–10.8)

## 2024-04-27 LAB — LIPID PANEL W/O CHOL/HDL RATIO
Cholesterol, Total: 130 mg/dL (ref 100–199)
HDL: 51 mg/dL
LDL Chol Calc (NIH): 58 mg/dL (ref 0–99)
Triglycerides: 116 mg/dL (ref 0–149)
VLDL Cholesterol Cal: 21 mg/dL (ref 5–40)

## 2024-04-27 LAB — TSH: TSH: 0.978 u[IU]/mL (ref 0.450–4.500)

## 2024-04-28 LAB — GC/CHLAMYDIA PROBE AMP
Chlamydia trachomatis, NAA: NEGATIVE
Neisseria Gonorrhoeae by PCR: NEGATIVE

## 2024-04-29 ENCOUNTER — Ambulatory Visit: Payer: Self-pay | Admitting: Family Medicine

## 2025-04-28 ENCOUNTER — Encounter: Admitting: Family Medicine
